# Patient Record
Sex: Female | Born: 1970 | Race: White | Hispanic: Yes | Marital: Single | Smoking: Never smoker
Health system: Southern US, Community
[De-identification: ages and names within clinical notes are randomized; demographics above are authoritative.]

## PROBLEM LIST (undated history)

## (undated) DIAGNOSIS — F329 Major depressive disorder, single episode, unspecified: Secondary | ICD-10-CM

## (undated) DIAGNOSIS — J45909 Unspecified asthma, uncomplicated: Secondary | ICD-10-CM

## (undated) DIAGNOSIS — F32A Depression, unspecified: Secondary | ICD-10-CM

## (undated) DIAGNOSIS — I10 Essential (primary) hypertension: Secondary | ICD-10-CM

## (undated) HISTORY — DX: Unspecified asthma, uncomplicated: J45.909

## (undated) HISTORY — DX: Depression, unspecified: F32.A

## (undated) HISTORY — DX: Essential (primary) hypertension: I10

## (undated) HISTORY — DX: Major depressive disorder, single episode, unspecified: F32.9

---

## 1999-05-26 ENCOUNTER — Other Ambulatory Visit: Admission: RE | Admit: 1999-05-26 | Discharge: 1999-05-26 | Payer: Self-pay | Admitting: Obstetrics and Gynecology

## 1999-09-18 ENCOUNTER — Encounter (INDEPENDENT_AMBULATORY_CARE_PROVIDER_SITE_OTHER): Payer: Self-pay | Admitting: Specialist

## 1999-09-18 ENCOUNTER — Inpatient Hospital Stay (HOSPITAL_COMMUNITY): Admission: AD | Admit: 1999-09-18 | Discharge: 1999-09-20 | Payer: Self-pay | Admitting: Obstetrics and Gynecology

## 1999-10-18 ENCOUNTER — Other Ambulatory Visit: Admission: RE | Admit: 1999-10-18 | Discharge: 1999-10-18 | Payer: Self-pay | Admitting: Obstetrics and Gynecology

## 2001-09-24 ENCOUNTER — Other Ambulatory Visit: Admission: RE | Admit: 2001-09-24 | Discharge: 2001-09-24 | Payer: Self-pay | Admitting: Obstetrics and Gynecology

## 2002-03-30 ENCOUNTER — Inpatient Hospital Stay (HOSPITAL_COMMUNITY): Admission: AD | Admit: 2002-03-30 | Discharge: 2002-04-01 | Payer: Self-pay | Admitting: Obstetrics & Gynecology

## 2002-03-30 ENCOUNTER — Encounter (INDEPENDENT_AMBULATORY_CARE_PROVIDER_SITE_OTHER): Payer: Self-pay | Admitting: Specialist

## 2002-05-14 ENCOUNTER — Other Ambulatory Visit: Admission: RE | Admit: 2002-05-14 | Discharge: 2002-05-14 | Payer: Self-pay | Admitting: Obstetrics and Gynecology

## 2006-03-13 ENCOUNTER — Ambulatory Visit: Payer: Self-pay | Admitting: Pulmonary Disease

## 2006-05-02 ENCOUNTER — Ambulatory Visit: Payer: Self-pay | Admitting: Pulmonary Disease

## 2007-11-27 ENCOUNTER — Emergency Department (HOSPITAL_COMMUNITY): Admission: EM | Admit: 2007-11-27 | Discharge: 2007-11-27 | Payer: Self-pay | Admitting: Emergency Medicine

## 2010-12-31 NOTE — Op Note (Signed)
Patty Howell, Patty Howell                               ACCOUNT NO.:  [id number]   MEDICAL RECORD NO.:  [id number]                   PATIENT TYPE:  INP   LOCATION:  9113                                 FACILITY:  [facility]   PHYSICIAN:  Guy Sandifer. Arleta Creek, M.D.           DATE OF BIRTH:  Feb 15, 1971   DATE OF PROCEDURE:  03/31/2002  DATE OF DISCHARGE:                                 OPERATIVE REPORT   PREOPERATIVE DIAGNOSIS:  Desires permanent sterilization.   POSTOPERATIVE DIAGNOSIS:  Desires permanent sterilization.   PROCEDURE:  Postpartum bilateral tubal ligation.   SURGEON:  Guy Sandifer. Henderson Cloud, M.D.   ANESTHESIA:  General with endotracheal intubation.   ANESTHESIOLOGIST:  Jonita Albee, M.D.   ESTIMATED BLOOD LOSS:  Drops.   INDICATIONS AND CONSENT:  This patient is a 40 year old married female, G6,  P5, status post delivery of a healthy child yesterday afternoon who is doing  well this morning.  Hgb is 11.6, white count 12.7 and platelet count  162,000.  The patient desires permanent sterilization.  Consent has been  signed.  Preoperatively, I discussed with the patient and her husband  bilateral tubal ligation, its potential risks and complications.  This was  also discussed via the rosaria translation line to assure complete  understanding by the patient.  The permanence, failure rate and increased  ectopic risks were discussed.  The potential risks were also discussed  including, but not limited to, infection, organ damage, bleeding requiring  transfusion of blo0od products with possible transfusion reaction, HIV and  hepatitis acquisition.  All questions were answered.  Consent was signed on  the chart.   DESCRIPTION OF PROCEDURE:  The patient was taken to the operating room where  she was placed in the dorsal supine position.  General anesthesia is induced  via endotracheal intubation.  She had been prepped and draped in a sterile  fashion.  A small infraumbilical incision was made  and dissection was  carried out in layers to the peritoneum.  The right fallopian tube was  identified from cornua to fimbria, grasped at its midampullary portion and  the intervening knuckle of tissue was doubly ligated with two free ties of 0  plain suture.  The knuckle was then sharply resected.  Cautery was used to  obtain complete hemostasis.  A similar procedure was carried out on the left  side.  Excellent hemostasis was noted bilaterally.  The incision was closed  with 0 Vicryl in a running fashion to close the fascia and 4-0 Vicryl  subcuticularly to close the skin.  The incision was injected with 0.5% plain  Marcaine prior to closure of the skin.  Good hemostasis was noted.  All  counts were correct.  The patient was awakened and taken to the recovery  room in stable condition.  Guy Sandifer Arleta Creek, M.D.   JET/MEDQ  D:  03/31/2002  T:  03/31/2002  Job:  [id number]

## 2011-05-10 LAB — CBC
HCT: 43.4
MCV: 89
Platelets: 195
RDW: 14.3
WBC: 8.2

## 2011-05-10 LAB — URINALYSIS, ROUTINE W REFLEX MICROSCOPIC
Protein, ur: NEGATIVE
Urobilinogen, UA: 0.2

## 2011-05-10 LAB — DIFFERENTIAL
Eosinophils Absolute: 0.8 — ABNORMAL HIGH
Eosinophils Relative: 10 — ABNORMAL HIGH
Lymphs Abs: 3.6
Monocytes Absolute: 0.4

## 2011-05-10 LAB — BASIC METABOLIC PANEL
BUN: 10
Chloride: 105
Glucose, Bld: 92
Potassium: 3.4 — ABNORMAL LOW

## 2011-05-10 LAB — URINE MICROSCOPIC-ADD ON

## 2011-10-23 ENCOUNTER — Telehealth: Payer: Self-pay

## 2011-10-23 ENCOUNTER — Ambulatory Visit (INDEPENDENT_AMBULATORY_CARE_PROVIDER_SITE_OTHER): Payer: Commercial Managed Care - PPO | Admitting: Family Medicine

## 2011-10-23 VITALS — BP 171/108 | HR 77 | Temp 97.3°F | Resp 24 | Ht 59.5 in | Wt 141.0 lb

## 2011-10-23 DIAGNOSIS — I10 Essential (primary) hypertension: Secondary | ICD-10-CM | POA: Insufficient documentation

## 2011-10-23 DIAGNOSIS — R1110 Vomiting, unspecified: Secondary | ICD-10-CM

## 2011-10-23 DIAGNOSIS — R1013 Epigastric pain: Secondary | ICD-10-CM

## 2011-10-23 MED ORDER — ONDANSETRON 8 MG PO TBDP: 8.0000 mg | ORAL_TABLET | Freq: Three times a day (TID) | ORAL | Status: AC | PRN

## 2011-10-23 MED ORDER — ESOMEPRAZOLE MAGNESIUM 40 MG PO CPDR: 40.0000 mg | DELAYED_RELEASE_CAPSULE | Freq: Every day | ORAL | Status: DC

## 2011-10-23 MED ORDER — LISINOPRIL 20 MG PO TABS: 20.0000 mg | ORAL_TABLET | Freq: Every day | ORAL | Status: DC

## 2011-10-23 NOTE — Telephone Encounter (Signed)
Pt was seen this am and given a free seven day trial for nexium. The promotion is no longer valid and pharmacy would like to know if they can change the prescription to entire 30 days supply as patient will have to pay a co pay of $25.00 for 7 pills or entire month.

## 2011-10-23 NOTE — Telephone Encounter (Signed)
Please call pharmacy back and ok Nexium 20 mg #30 one daily with no refills.

## 2011-10-23 NOTE — Progress Notes (Signed)
This is a 41 year old woman who comes in with 2 children in tow complaining of 2 days of nausea epigastric pain and elevated blood pressure. She's had the epigastric pain in the past over 5 years and it comes on intermittently. She had no diarrhea with it. He says sometimes it comes after eating although can also happen in the middle the night. She has no reflux symptoms.  She complains of no headache, chest pain or shortness of breath. She has no edema.  Objective: Middle-age woman in no acute distress, smiling and pleasant.  HEENT: Unremarkable  Chest: Clear  Heart: Regular no murmur or gallop  Abdomen: Soft nontender without guarding or rebound-no HSM  Skin warm and dry, no edema  Assessment: Hypertension, acute epigastric distress possibly secondary to gallstones  Plan: As patient to return in 24 hours if no better, start lisinopril 20 daily, Zofran 8 mg when necessary #5 every 8 hours, and Nexium 40 mg daily x7

## 2011-10-23 NOTE — Patient Instructions (Signed)
Hipertensin (Hypertension) Cuando el corazn late bombea la sangre a travs de las arterias. La fuerza que se origina es la presin arterial. Si la presin es demasiado elevada, se denomina hipertensin. El peligro radica en que puede sufrirla y no saberlo. Hipertensin puede significar que su corazn debe trabajar ms intensamente para bombear sangre. Las arterias pueden estar estrechas o rgidas. El trabajo extra aumenta el riesgo de enfermedades cardacas, ictus y otros problemas.  La presin arterial est formada por dos nmeros: el nmero mayor sobre el nmero menor, por ejemplo110/70. Se seala "110/72". Los valores ideales son por debajo de 120 para el nmero ms alto (sistlica) y por debajo de 80 para el ms bajo (diastlica). Anote su presin sangunea hoy. Debe prestar mucha atencin a su presin arterial si sufre alguna otra enfermedad como:  Insuficiencia cardaca   Ataques cardiacos previos   Diabetes   Enfermedad renal crnica   Ictus previo   Mltiples factores de riesgo para enfermedades cardacas  Para diagnosticar si usted sufre hipertensin arterial, debe medirse la presin mientras encuentra sentado con el brazo elevado a la altura del nivel del corazn. Debe medirse al menos 2 veces. Una nica lectura de presin arterial elevada (especialmente en el servicio de emergencias) no significa que necesita tratamiento. Hay enfermedades en las que la presin arterial es diferente en ambos brazos. Es importante que consulte rpidamente con su mdico para un control. La mayora de las personas sufren hipertensin esencial, lo que significa que no tiene una causa especfica. Este tipo de hipertensin puede bajarse modificando algunos factores en el estilo de vida como:  Estrs.   El consumo de cigarrillos.   La falta de actividad fsica.   Peso excesivo   Consumo de drogas y alcohol.   Consumiendo menos sal  La mayora de las personas no tienen sntomas hasta que la  hipertensin ocasiona un dao en el organismo. El tratamiento efectivo puede evitar, demorar o reducir ese dao. TRATAMIENTO: El tratamiento para la hipertensin, cuando se ha identificado una causa, est dirigido a la misma Hay un gran nmero en medicamentos para tratarla. Se agrupan en diferentes categoras y el mdico seleccionar los medicamentos indicados para usted. Muchos medicamentos disponibles tienen efectos secundarios. Debe comentar los efectos secundarios con su mdico. Si la presin arterial permanece elevada despus de modificar su estilo de vida o comenzar a tomar medicamentos:  Los medicamentos deben ser reemplazados   Puede ser necesario evaluar otros problemas.   Debe estar seguro que comprende las indicaciones, que sabe cmo y cundo tomar los medicamentos.   Asegrese de realizar un control con su mdico dentro del tiempo indicado (generalmente dentro de las dos semanas) para volver a evaluar la presin arterial y revisar los medicamentos prescritos.   Si est tomando ms de un medicamento para la presin arterial, asegrese que sabe cmo y en qu momentos debe tomarlos. Tomar los medicamentos al mismo tiempo puede dar como resultado un gran descenso en la presin arterial.  SOLICITE ATENCIN MDICA DE INMEDIATO SI PRESENTA:  Dolor de cabeza intenso, visin borrosa o cambios en la visin, o confusin.   Debilidad o adormecimientos inusuales o sensacin de desmayo.   Dolor de pecho o abdominal intenso, vmitos o problemas para respirar.  ASEGRESE QUE:   Comprende estas instrucciones.   Controlar su enfermedad.   Solicitar ayuda inmediatamente si no mejora o si empeora.  Document Released: 08/01/2005 Document Revised: 07/21/2011 ExitCare Patient Information 2012 [employer]. 

## 2011-10-24 MED ORDER — ESOMEPRAZOLE MAGNESIUM 40 MG PO CPDR: 40.0000 mg | DELAYED_RELEASE_CAPSULE | Freq: Every day | ORAL | Status: AC

## 2011-10-31 ENCOUNTER — Ambulatory Visit (INDEPENDENT_AMBULATORY_CARE_PROVIDER_SITE_OTHER): Payer: Commercial Managed Care - PPO | Admitting: Family Medicine

## 2011-10-31 VITALS — BP 143/87 | HR 71 | Temp 97.8°F | Resp 16 | Ht 59.0 in | Wt 140.6 lb

## 2011-10-31 DIAGNOSIS — R42 Dizziness and giddiness: Secondary | ICD-10-CM

## 2011-10-31 DIAGNOSIS — R51 Headache: Secondary | ICD-10-CM

## 2011-10-31 MED ORDER — BUTALBITAL-APAP-CAFFEINE 50-325-40 MG PO TABS: 1.0000 | ORAL_TABLET | Freq: Two times a day (BID) | ORAL | Status: AC | PRN

## 2011-10-31 MED ORDER — FLUOXETINE HCL 20 MG PO TABS: 20.0000 mg | ORAL_TABLET | Freq: Every day | ORAL | Status: DC

## 2011-10-31 NOTE — Progress Notes (Signed)
Abdominal pain is resolved.  Still having daily headaches and some dizziness.  Wants what she had last year for the dizziness and headaches (fluoxetine, according to paper chart).  O:   Exam unchanged - alert, NAD Neuro intact HEENT unremarkable Chest clear Heart reg Abdomen: soft nontender  A:  Depression equivalent  P: fluoxetine 20 qd esgic bid prn

## 2012-07-09 ENCOUNTER — Other Ambulatory Visit: Payer: Self-pay | Admitting: Family Medicine

## 2012-07-09 NOTE — Telephone Encounter (Signed)
Please pull chart - not rx'ed in [facility] 

## 2012-07-10 NOTE — Telephone Encounter (Signed)
Chart pulled to PA pool at nurses station DOS 12/24/09

## 2012-11-20 ENCOUNTER — Ambulatory Visit (INDEPENDENT_AMBULATORY_CARE_PROVIDER_SITE_OTHER): Payer: Commercial Managed Care - PPO | Admitting: Family Medicine

## 2012-11-20 VITALS — BP 154/93 | HR 72 | Temp 97.8°F | Resp 16 | Ht 58.5 in | Wt 143.6 lb

## 2012-11-20 DIAGNOSIS — F3289 Other specified depressive episodes: Secondary | ICD-10-CM

## 2012-11-20 DIAGNOSIS — I10 Essential (primary) hypertension: Secondary | ICD-10-CM

## 2012-11-20 DIAGNOSIS — F32A Depression, unspecified: Secondary | ICD-10-CM

## 2012-11-20 DIAGNOSIS — F329 Major depressive disorder, single episode, unspecified: Secondary | ICD-10-CM

## 2012-11-20 MED ORDER — LISINOPRIL 10 MG PO TABS: 10.0000 mg | ORAL_TABLET | Freq: Two times a day (BID) | ORAL | Status: DC

## 2012-11-20 MED ORDER — FLUOXETINE HCL 20 MG PO TABS: 20.0000 mg | ORAL_TABLET | Freq: Every day | ORAL | Status: DC

## 2012-11-20 NOTE — Patient Instructions (Signed)
La dieta DASH  (DASH Diet) La dieta DASH (por su nombre en ingls) significa "Abordaje diettico para detener la hipertensin". Es un plan de alimentacin saludable que ha demostrado reducir la presin arterial elevada (hipertensin) en tan solo [address] mientras probablemente tambin ofrezca otros beneficios significativos para la salud. Estos otros beneficios incluyen la reduccin del riesgo de sufrir cncer de mama despus de la menopausia y de sufrir diabetes tipo 2, enfermedades cardacas, cncer de colon e ictus. Los beneficios para la salud tambin incluyen la prdida de peso y la disminucin del riesgo de insuficiencia renal en pacientes con enfermedad renal crnica.  GUAS PARA LA DIETA   Limite la cantidad de sal (sodio). Su dieta debe contener menos de 1500 mg de [employer].  Limite el consumo de carbohidratos refinados o procesados. Su dieta debe incluir principalmente granos enteros. Consuma postres con moderacin y limite el agregado de Location manager.  Incluya pequeas cantidades de grasas saludables para el corazn. Estos tipos de grasas estn contenidas en las nueces, aceites y [place] y trans. Estas grasas han demostrado ser perjudiciales para el organismo. ELECCIN DE LOS ALIMENTOS  Los siguientes grupos de alimentos se basan en una dieta de 2000 caloras. Consulte a su nutricionista matriculado cules son sus necesidades calricas individuales.  Granos y productos elaborados con granos (6 a 8 porciones por Training and development officer).   Consuma ms a menudo: Pan integral, arroz integral, pasta de trigo o de [place] integral, quinoa, palomitas de maz sin grasa o sal aadida (infladas).  Consuma con menos frecuencia:  Pan blanco, pastas blancas, arroz blanco, pan de maz. Verduras (4 a 5 porciones por Training and development officer).   Consuma ms a menudo: Hortalizas frescas, congeladas y IT sales professional. Puede consumir las hortalizas crudas, al vapor, asadas o grilladas con una mnima cantidad de  grasas.  Consuma con menos frecuencia o evite: Vegetales con crema o fritos. Verduras en [place]. Frutas (4 a 5 porciones por da).  Consuma ms a menudo: Frutas frescas, en conserva (en su jugo natural) o frutas congeladas. Frutas secas sin el agregado de azcar. Cien por ciento jugo de fruta ( taza [237 ml] por da).  Consuma con menos frecuencia: Frutas secas con azcar agregado. Fruta enlatada en almbar light o espeso. Carnes magras, pescado y aves de corral (2 porciones o Programmer, multimedia. Una porcin es de 3 a 4 oz. HD:[id number] g]).  Consuma ms a menudo: Carne molida 90% magra o ms de lomo o solomillo . Cortes redondos de carne, [place] de pollo y de [place]. Todos los pescados. Prepare la carne al horno o asada a la parrilla o al grill. Nada debe ser frito.  Consuma con menos frecuencia o evite: Cortes grasos de carne, [place] o pata, muslo o ala de pollo. Cortes de carne o pescado fritos. Lcteos (2 a 3 porciones)  Consuma ms a menudo: La [employer] o con bajo contenido de grasa, yogur descremado o semidescremado, queso con bajo contenido en grasa o parcialmente descremado.  Consuma con menos frecuencia o evite: Leche (entera, al 2%). [place] Quesos con toda su grasa. Nueces, semillas y legumbres (4 a 5 porciones por semana).  Consuma ms a menudo: Todos los alimentos sin sal agregada.  Consuma con menos frecuencia o evite: Nueces y semillas con sal, frijoles enlatados con sal agregada. Grasas y dulces (limitados)  Consuma ms a menudo: Art gallery manager, margarinas en pote sin grasas trans, gelatina sin azcar. [place] y aderezos para Associate Professor.  Consuma  con menos frecuencia o evite: Aceites de coco, aceites de palma, mantequilla, margarina en barra, crema, [employer] y [address], [place], [place], [place]. Irven Shelling MS INFORMACIN  El Dash Diet Eating Plan (Plan Nutricional Dieta Dash): www.dashdiet.org  Document Released: 07/21/2011 Document  Revised: 10/24/2011 [facility] Patient Information 2013 [place], [place]. Hipertensin (Hypertension) Cuando el corazn late [employer] sangre a travs de las arterias. La fuerza que se origina es la presin arterial. Si la presin es demasiado elevada, se denomina hipertensin. El peligro radica en que puede sufrirla y no saberlo. Hipertensin puede significar que su corazn debe trabajar ms intensamente para bombear sangre. Las arterias pueden Dietitian o rgidas. El [place] extra [place] el riesgo de enfermedades cardacas, ictus y [employer].  La presin arterial est formada por dos nmeros: el nmero mayor sobre el nmero menor, por ejemplo110/70. Se seala "110/72". Los valores ideales son por debajo de 120 para el nmero ms alto (sistlica) y por debajo de 80 para el ms bajo (diastlica). Anote su presin sangunea hoy. Debe prestar mucha atencin a su presin arterial si sufre alguna otra enfermedad como:  Insuficiencia cardaca  Ataques cardiacos previos  Diabetes  Enfermedad renal crnica  Ictus previo  Mltiples factores de riesgo para enfermedades cardacas Para diagnosticar si usted sufre hipertensin arterial, debe medirse la presin mientras encuentra sentado con el brazo elevado a la altura del nivel del corazn. Debe medirse al menos 2 veces. Una nica lectura de presin arterial elevada (especialmente en el servicio de emergencias) no significa que necesita tratamiento. Hay enfermedades en las que la presin arterial es diferente en ambos brazos. Es importante que consulte rpidamente con su mdico para un control. La [employer] de las personas sufren hipertensin esencial, lo que significa que no tiene una causa especfica. Este tipo de hipertensin puede bajarse modificando algunos factores en el estilo de vida como:  Librarian, academic.  El consumo de cigarrillos.  La falta de actividad fsica.  Peso excesivo  Consumo de drogas y alcohol.  Consumiendo menos sal La  mayora de las personas no tienen sntomas hasta que la hipertensin ocasiona un dao en el organismo. El tratamiento efectivo puede evitar, Designer, industrial/product o reducir ese dao. TRATAMIENTO: El tratamiento para la hipertensin, cuando se ha identificado una causa, est dirigido a la misma Hay un gran nmero en medicamentos para tratarla. Se agrupan en diferentes categoras y Media planner los medicamentos indicados para usted. Muchos medicamentos disponibles tienen efectos secundarios. Debe comentar los efectos secundarios con su mdico. Si la presin arterial permanece elevada despus de modificar su estilo de vida o comenzar a tomar medicamentos:  Los medicamentos deben ser reemplazados  Puede ser necesario evaluar otros problemas.  Debe estar seguro que comprende las indicaciones, que sabe cmo y cundo [employer].  Asegrese de Education officer, environmental un control con su mdico dentro del tiempo indicado (generalmente dentro de las [employer]) para volver a Systems analyst presin arterial y Dentist los medicamentos prescritos.  Si est tomando ms de un medicamento para la presin arterial, asegrese que sabe cmo y en qu momentos debe tomarlos. Tomar los medicamentos al mismo tiempo puede dar como resultado un gran descenso en la presin arterial. SOLICITE ATENCIN MDICA DE INMEDIATO SI PRESENTA:  Dolor de cabeza intenso, visin borrosa o cambios en la visin, o confusin.  Debilidad o adormecimientos inusuales o sensacin de desmayo.  Dolor de pecho o abdominal intenso, vmitos o problemas para respirar. ASEGRESE QUE:   Comprende estas instrucciones.  Controlar su enfermedad.  Solicitar ayuda inmediatamente si no mejora o si empeora. Document Released: 08/01/2005 Document Revised: 10/24/2011 [facility] Patient Information 2013 [place], [place].

## 2012-11-20 NOTE — Progress Notes (Signed)
 [facility]:  Office Visit  Chief Complaint:  Chief Complaint  Patient presents with  . Medication Refill    fluoxetine  . Headache    HPI: Patty Howell is a 42 y.o. female who complains of  3 day history of headache with nausea. She took tylenol for HA and has gotten better. No problem with CP/SOB, vision changes, n/v. She has been out for last 3 months of both depression and HTN meds.  HTN-Out of meds for 3 months. Compliant with meds when she has them. Does not follow low salt diet. Dizziness with sitting and standing. 1 x per week. Does not have this problem with medicine. No cough, numbness, tingling.  Depression-has had this for 7-8 years. Feels good on meds. Has not been on meds for 3 months. No SI/HI.   Past Medical History  Diagnosis Date  . Asthma   . Hypertension   . Depression    History reviewed. No pertinent past surgical history. History   Social History  . Marital Status: Single    Spouse Name: N/A    Number of Children: N/A  . Years of Education: N/A   Social History Main Topics  . Smoking status: Never Smoker   . Smokeless tobacco: Never Used  . Alcohol Use: No  . Drug Use: No  . Sexually Active: None   Other Topics Concern  . None   Social History Narrative  . None   Family History  Problem Relation Age of Onset  . Hypertension Mother    No Known Allergies Prior to Admission medications   Medication Sig Start Date End Date Taking? Authorizing Provider  FLUoxetine (PROZAC) 20 MG tablet Take 20 mg by mouth daily.   Yes Historical Provider, MD  FLUoxetine (PROZAC) 20 MG tablet Take 1 tablet (20 mg total) by mouth daily. 10/31/11 01/29/12  Elvina Sidle, MD  ondansetron (ZOFRAN-ODT) 8 MG disintegrating tablet Take 8 mg by mouth every 8 (eight) hours as needed.    Historical Provider, MD     ROS: The patient denies fevers, chills, night sweats, unintentional weight loss, chest pain, palpitations, wheezing, dyspnea on exertion,  nausea, vomiting, abdominal pain, dysuria, hematuria, melena, numbness, weakness, or tingling.   All other systems have been reviewed and were otherwise negative with the exception of those mentioned in the HPI and as above.    PHYSICAL EXAM: Filed Vitals:   11/20/12 1640  BP: 154/93  Pulse: 72  Temp: 97.8 F (36.6 C)  Resp: 16   Filed Vitals:   11/20/12 1640  Height: 4' 10.5" (1.486 m)  Weight: 143 lb 9.6 oz (65.137 kg)   Body mass index is 29.5 kg/(m^2).  General: Alert, no acute distress HEENT:  Normocephalic, atraumatic, oropharynx patent. EOMI, PERRLA. + dental caries.  Cardiovascular:  Regular rate and rhythm, no rubs murmurs or gallops.  No Carotid bruits, radial pulse intact. No pedal edema.  Respiratory: Clear to auscultation bilaterally.  No wheezes, rales, or rhonchi.  No cyanosis, no use of accessory musculature GI: No organomegaly, abdomen is soft and non-tender, positive bowel sounds.  No masses. Skin: No rashes. Neurologic: Facial musculature symmetric. Psychiatric: Patient is appropriate throughout our interaction. Lymphatic: No cervical lymphadenopathy Musculoskeletal: Gait intact.   LABS: Results for orders placed in visit on 11/20/12  COMPREHENSIVE METABOLIC PANEL      Result Value Range   Sodium 138  135 - 145 mEq/L   Potassium 4.2  3.5 - 5.3 mEq/L   Chloride  106  96 - 112 mEq/L   CO2 24  19 - 32 mEq/L   Glucose, Bld 88  70 - 99 mg/dL   BUN 13  6 - 23 mg/dL   Creat 4.78  2.95 - 6.21 mg/dL   Total Bilirubin 0.3  0.3 - 1.2 mg/dL   Alkaline Phosphatase 69  39 - 117 U/L   AST 21  0 - 37 U/L   ALT 20  0 - 35 U/L   Total Protein 7.6  6.0 - 8.3 g/dL   Albumin 4.0  3.5 - 5.2 g/dL   Calcium 9.0  8.4 - 30.8 mg/dL  TSH      Result Value Range   TSH 2.676  0.350 - 4.500 uIU/mL     EKG/XRAY:   Primary read interpreted by Dr. Conley Rolls at [facility].   ASSESSMENT/PLAN: Encounter Diagnoses  Name Primary?  . HTN (hypertension) Yes  . Depression    Refilled  Prozac Refilled Lisonpril changefrom 20 mg daily  to 10 mg BID to have less dizzy sxs with smaller dose and change in frequency I did not check orthostatics, since only occurs 1x per week. Advise to get up slowly when and move slowly during transition F/u in 6 months for BP check or sooner if dizziness continues     ,  PHUONG, DO 11/21/2012 6:22 PM

## 2012-11-21 ENCOUNTER — Encounter: Payer: Self-pay | Admitting: Family Medicine

## 2012-11-21 LAB — COMPREHENSIVE METABOLIC PANEL
Alkaline Phosphatase: 69 U/L (ref 39–117)
BUN: 13 mg/dL (ref 6–23)
Creat: 0.66 mg/dL (ref 0.50–1.10)
Glucose, Bld: 88 mg/dL (ref 70–99)
Sodium: 138 mEq/L (ref 135–145)
Total Bilirubin: 0.3 mg/dL (ref 0.3–1.2)
Total Protein: 7.6 g/dL (ref 6.0–8.3)

## 2012-11-21 LAB — COMPREHENSIVE METABOLIC PANEL WITH GFR
ALT: 20 U/L (ref 0–35)
AST: 21 U/L (ref 0–37)
Albumin: 4 g/dL (ref 3.5–5.2)
CO2: 24 meq/L (ref 19–32)
Calcium: 9 mg/dL (ref 8.4–10.5)
Chloride: 106 meq/L (ref 96–112)
Potassium: 4.2 meq/L (ref 3.5–5.3)

## 2012-11-21 LAB — TSH: TSH: 2.676 u[IU]/mL (ref 0.350–4.500)

## 2012-11-22 ENCOUNTER — Telehealth: Payer: Self-pay | Admitting: Radiology

## 2012-11-22 NOTE — Telephone Encounter (Signed)
Message copied by Caffie Damme on Thu Nov 22, 2012  3:38 PM ------      Message from: LE, [place] P      Created: Thu Nov 22, 2012  1:03 PM       Please let her know that kidneys, liver, thyroid are normal. See her in 6 months for follow-up ------

## 2012-11-22 NOTE — Telephone Encounter (Signed)
Called patient to advise. Left message for patient to call back.

## 2012-11-25 NOTE — Telephone Encounter (Signed)
Spoke with pt advised labs normal. Pt understood

## 2013-02-04 ENCOUNTER — Ambulatory Visit (INDEPENDENT_AMBULATORY_CARE_PROVIDER_SITE_OTHER): Payer: Commercial Managed Care - PPO | Admitting: Internal Medicine

## 2013-02-04 VITALS — BP 110/70 | HR 70 | Temp 97.4°F | Resp 18 | Ht 59.0 in | Wt 137.0 lb

## 2013-02-04 DIAGNOSIS — R51 Headache: Secondary | ICD-10-CM

## 2013-02-04 DIAGNOSIS — R4589 Other symptoms and signs involving emotional state: Secondary | ICD-10-CM

## 2013-02-04 DIAGNOSIS — R519 Headache, unspecified: Secondary | ICD-10-CM

## 2013-02-04 DIAGNOSIS — R42 Dizziness and giddiness: Secondary | ICD-10-CM

## 2013-02-04 DIAGNOSIS — F329 Major depressive disorder, single episode, unspecified: Secondary | ICD-10-CM

## 2013-02-04 MED ORDER — SERTRALINE HCL 100 MG PO TABS: 100.0000 mg | ORAL_TABLET | Freq: Every day | ORAL | Status: DC

## 2013-02-04 MED ORDER — ONDANSETRON 8 MG PO TBDP: 8.0000 mg | ORAL_TABLET | Freq: Three times a day (TID) | ORAL | Status: DC | PRN

## 2013-02-04 NOTE — Patient Instructions (Addendum)
Stop fluoxetine and start sertraline Take ondarsetron(zofran) for nausea of Headache and take 2 tylenol for Headache-if not better in 2 months then recheck

## 2013-02-04 NOTE — Progress Notes (Signed)
  Subjective:    Patient ID: Patty Howell, female    DOB: 1970-11-24, 42 y.o.   MRN: [id number]  HPI translation is by 8th grade daughter c/o daily HAs that cause dizziness, nausea and vomiting after meals especially See 11/2012--restarting meds didn't alter this problem which has been present for several years. No nocturnal headaches but occasionally present when wakes. No vision changes. Gets dizzy and heat particularly makes headache and dizziness worse. Mother and daughter are in counseling and daughter think she causes the stress that makes her mother need to be on Prozac. She has been on Prozac for several years and feels like it is not effective this time. Denies reflux.  Note last labs normal including TSH   Review of Systems  Constitutional: Negative for fever, activity change, appetite change, fatigue and unexpected weight change.  Eyes: Negative for photophobia and visual disturbance.  Respiratory: Negative for shortness of breath.   Cardiovascular: Negative for chest pain, palpitations and leg swelling.  Gastrointestinal: Negative for abdominal pain, diarrhea and constipation.  Genitourinary: Negative for difficulty urinating.  Neurological: Negative for tremors, syncope, speech difficulty, weakness and numbness.  Psychiatric/Behavioral: Negative for sleep disturbance.       Objective:   Physical Exam BP 110/70  Pulse 70  Temp(Src) 97.4 F (36.3 C) (Oral)  Resp 18  Ht 4\' 11"  (1.499 m)  Wt 137 lb (62.143 kg)  BMI 27.66 kg/m2  SpO2 100%  LMP 01/23/2013 Pupils equal round reactive to light and accommodation/EOMs conjugate/conjunctiva clear ENT clear No thyromegaly Heart regular without murmur click/no carotid bruits Lungs clear Extremities no edema/good peripheral pulses Neurological intact       Assessment & Plan:  Problem #1 daily headaches Problem #2 dizzy spells associated with headaches Problem #3 nausea and vomiting associated with headaches post  eating Problem #4 depression Problem #5 hypertension now controlled  Change Prozac to Zoloft Continue lisinopril 10 mg twice a day Use Tylenol for headache plus Zofran for nausea Recheck in one month if not stable  Meds ordered this encounter  Medications  . sertraline (ZOLOFT) 100 MG tablet    Sig: Take 1 tablet (100 mg total) by mouth daily.    Dispense:  30 tablet    Refill:  11  . ondansetron (ZOFRAN-ODT) 8 MG disintegrating tablet    Sig: Take 1 tablet (8 mg total) by mouth every 8 (eight) hours as needed.    Dispense:  20 tablet    Refill:  3

## 2013-08-10 ENCOUNTER — Ambulatory Visit: Payer: Commercial Managed Care - PPO

## 2013-08-10 ENCOUNTER — Ambulatory Visit (INDEPENDENT_AMBULATORY_CARE_PROVIDER_SITE_OTHER): Payer: Commercial Managed Care - PPO | Admitting: Internal Medicine

## 2013-08-10 VITALS — BP 158/100 | HR 97 | Temp 97.6°F | Resp 16 | Ht 59.0 in | Wt 137.8 lb

## 2013-08-10 DIAGNOSIS — M79671 Pain in right foot: Secondary | ICD-10-CM

## 2013-08-10 DIAGNOSIS — M722 Plantar fascial fibromatosis: Secondary | ICD-10-CM

## 2013-08-10 DIAGNOSIS — J45909 Unspecified asthma, uncomplicated: Secondary | ICD-10-CM

## 2013-08-10 DIAGNOSIS — I10 Essential (primary) hypertension: Secondary | ICD-10-CM

## 2013-08-10 DIAGNOSIS — M79609 Pain in unspecified limb: Secondary | ICD-10-CM

## 2013-08-10 MED ORDER — ALBUTEROL SULFATE HFA 108 (90 BASE) MCG/ACT IN AERS: 2.0000 | INHALATION_SPRAY | Freq: Four times a day (QID) | RESPIRATORY_TRACT | Status: DC | PRN

## 2013-08-10 MED ORDER — MELOXICAM 15 MG PO TABS: 15.0000 mg | ORAL_TABLET | Freq: Every day | ORAL | Status: DC

## 2013-08-10 NOTE — Progress Notes (Signed)
Subjective:  This chart was scribed for Patty Sia, MD by Carl Best, Medical Scribe. This patient was seen in Room 13 and the patient's care was started at 11:29 AM.   Patient ID: Patty Howell, female    DOB: Nov 01, 1970, 42 y.o.   MRN: [id number]  HPI HPI Comments: [name] is a 42 y.o. female who presents to the [facility] complaining of constant right heel pain that started two months ago.  The patient's daughter states that walking aggravates the pain.  She states that the patient's pain is the most severe in the morning.  No known injury  Also complaining of two-day history of coughing and wheezing without fever sore throat or nasal congestion There is an underlying history of asthma and she no longer has any medication/she typically needs albuterol occasionally Cough is nonproductive  Past Medical History  Diagnosis Date  . Asthma   . Hypertension   . Depression    History reviewed. No pertinent past surgical history. Family History  Problem Relation Age of Onset  . Hypertension Mother    History   Social History  . Marital Status: Single    Spouse Name: N/A    Number of Children: N/A  . Years of Education: N/A   Occupational History  . Not on file.   Social History Main Topics  . Smoking status: Never Smoker   . Smokeless tobacco: Never Used  . Alcohol Use: No  . Drug Use: No  . Sexual Activity: Not on file   Other Topics Concern  . Not on file   Social History Narrative  . No narrative on file   No Known Allergies  Review of Systems A complete 10 system review of systems was obtained and all systems are negative except as noted in the HPI and PMH.  And also that she is no longer taking her medicine for hypertension although she has refills at the drugstore   Objective:  Physical Exam  Nursing note and vitals reviewed. Constitutional: She is oriented to person, place, and time. She appears well-developed and well-nourished.  No distress.  HENT:  Head: Normocephalic and atraumatic.  Eyes: Conjunctivae and EOM are normal. Pupils are equal, round, and reactive to light.  Neck: Normal range of motion and phonation normal. Neck supple.  Cardiovascular: Normal rate, regular rhythm and intact distal pulses.   Pulmonary/Chest: Effort normal and breath sounds normal. She exhibits no tenderness.  Abdominal: Soft. She exhibits no distension. There is no tenderness. There is no guarding.  Musculoskeletal: Normal range of motion.  Neurological: She is alert and oriented to person, place, and time. No cranial nerve deficit. She exhibits normal muscle tone.  Skin: Skin is warm and dry.  Psychiatric: She has a normal mood and affect. Her behavior is normal. Judgment and thought content normal.      [facility] preliminary x-ray report read by Dr. Merla Riches - heel spur present  BP 158/100  Pulse 97  Temp(Src) 97.6 F (36.4 C) (Oral)  Resp 16  Ht 4\' 11"  (1.499 m)  Wt 137 lb 12.8 oz (62.506 kg)  BMI 27.82 kg/m2  SpO2 100%  LMP 07/31/2013  Assessment & Plan:  Pain, heel, right - Plan: DG Os Calcis Right  Plantar fasciitis - Plan: meloxicam (MOBIC) 15 MG tablet  RAD (reactive airway disease)  Unspecified essential hypertension  Plan as detailed in the patient instructions for her plantar fasciitis Call in one month if not well for referral to  podiatry Restart albuterol Restart lisinopril Followup blood pressure in one month Followup asthma 1-3 months  I have completed the patient encounter in its entirety as documented by the scribe, with editing by me where necessary. Robert P. Merla Riches, M.D.

## 2013-08-10 NOTE — Patient Instructions (Signed)
Fascitis plantar (Plantar Fascitis) La fascitis plantar es un trastorno frecuente que ocasiona dolor en el pie. Se trata de una inflamacin (irritacin) de la banda de tejidos fibrosos y resistentes que se extienden desde el hueso del taln (calcneo) hasta la parte anterior de la planta del pie. Esta inflamacin puede deberse a que ha permanecido Intel, a zapatos que no Holiday representative, a correr Aflac Incorporated, al sobrepeso, a un andar anormal y el uso excesivo del pie con dolor (esto es frecuente en los corredores). Tambin es frecuente [employer] que practican ejercicios aerbicos y [place] bailarines de ballet. SNTOMAS La persona que sufre fascitis plantar manifiesta:  Dolor intenso por la Assurant parte inferior del pie, especialmente al dar los primeros pasos luego de levantarse de la cama. Este dolor disminuye luego de caminar algunos minutos.  Dolor intenso al caminar Express Scripts de un tiempo prolongado de inactividad.  El dolor empeora al caminar descalzo o al subir escaleras. DIAGNSTICO  El mdico har el diagnstico examinando sus pies.  En general no es necesario indicar radiografas. PREVENCIN  Consulte a Nurse, mental health en medicina del deporte antes de comenzar un nuevo programa de ejercicios.  Los programas de caminatas ofrecen un buen entrenamiento. Hay una menor probabilidad de sufrir lesiones por uso excesivo, que son frecuentes [place] personas que corren. Hay menos impacto y menos agresin a las articulaciones.  Comience lentamente todo nuevo programa de ejercicios. Si aparece algn problema o dolor, disminuya la cantidad de tiempo o la distancia hasta que se encuentre cmodo.  Use calzado de buena calidad y reemplcelo regularmente.  Estire el pie y los ligamentos que se encuentran en la parte posterior del tobillo (tendn de [place]) antes y despus de Education officer, environmental actividad fsica.  Corra o practique ejercicios sobre  superficies parejas que no sean duras. Por ejemplo, el asfalto es mejor que el pavimento.  No corra descalzo sobre superficies duras.  Si camina sobre cinta, vare la inclinacin.  No siga con el entrenamiento si tiene problemas en el pie o en la articulacin. Busque ayuda profesional si no mejora. INSTRUCCIONES PARA EL CUIDADO DOMICILIARIO  Evite las SUPERVALU INC causan dolor hasta que se recupere.  Use hielo o compresas fras sobre las zonas doloridas despus de Education officer, environmental ejercicios.  Only take over-the-counter or prescription medicines for pain, discomfort, or fever as directed by your caregiver.  Las plantillas blandas o las zapatillas con base de aire o gel pueden ser de [place].  Si los problemas continan o se agravan, consulte a Music therapist en medicina del deporte o con su mdico personal. La cortisona es un potente antiinflamatorio que puede inyectarse en la zona dolorida. El profesional que lo asiste comentar este tratamiento con usted. EST SEGURO QUE:   Comprende las instrucciones para el alta mdica.  Controlar su enfermedad.  Solicitar atencin mdica de inmediato segn las indicaciones. Document Released: 05/11/2005 Document Revised: 10/24/2011 [facility] Patient Information 2014 [place], [place]. Plantar Fasciitis Plantar fasciitis is a common condition that causes foot pain. It is soreness (inflammation) of the band of tough fibrous tissue on the bottom of the foot that runs from the heel bone (calcaneus) to the ball of the foot. The cause of this soreness may be from excessive standing, poor fitting shoes, running on hard surfaces, being overweight, having an abnormal walk, or overuse (this is common in runners) of the painful foot or feet. It is also common in aerobic exercise dancers and ballet  dancers. SYMPTOMS  Most people with plantar fasciitis complain of:  Severe pain in the morning on the bottom of their foot especially when taking the first steps  out of bed. This pain recedes after a few minutes of walking.  Severe pain is experienced also during walking following a long period of inactivity.  Pain is worse when walking barefoot or up stairs DIAGNOSIS   Your caregiver will diagnose this condition by examining and feeling your foot.  Special tests such as X-rays of your foot, are usually not needed. PREVENTION   Consult a sports medicine professional before beginning a new exercise program.  Walking programs offer a good workout. With walking there is a lower chance of overuse injuries common to runners. There is less impact and less jarring of the joints.  Begin all new exercise programs slowly. If problems or pain develop, decrease the amount of time or distance until you are at a comfortable level.  Wear good shoes and replace them regularly.  Stretch your foot and the heel cords at the back of the ankle (Achilles tendon) both before and after exercise.  Run or exercise on even surfaces that are not hard. For example, asphalt is better than pavement.  Do not run barefoot on hard surfaces.  If using a treadmill, vary the incline.  Do not continue to workout if you have foot or joint problems. Seek professional help if they do not improve. HOME CARE INSTRUCTIONS   Avoid activities that cause you pain until you recover.  Use ice or cold packs on the problem or painful areas after working out.  Only take over-the-counter or prescription medicines for pain, discomfort, or fever as directed by your caregiver.  Soft shoe inserts or athletic shoes with air or gel sole cushions may be helpful.  If problems continue or become more severe, consult a sports medicine caregiver or your own health care provider. Cortisone is a potent anti-inflammatory medication that may be injected into the painful area. You can discuss this treatment with your caregiver. MAKE SURE YOU:   Understand these instructions.  Will watch your  condition.  Will get help right away if you are not doing well or get worse. Document Released: 04/26/2001 Document Revised: 10/24/2011 Document Reviewed: 06/25/2008 [facility] Patient Information 2014 [place], [place].   PLAN= Heel pad for that foot--available in drugstore  Stretches for foot twice a day  Ice to heel for 20 minutes after work  Mobic(meloxicam) daily for 1 month  If this doesn't cure it then we will make appointment for her with a foot specialist---you may call back

## 2013-11-05 ENCOUNTER — Ambulatory Visit (INDEPENDENT_AMBULATORY_CARE_PROVIDER_SITE_OTHER): Payer: Commercial Managed Care - PPO | Admitting: Emergency Medicine

## 2013-11-05 VITALS — BP 150/92 | HR 72 | Temp 97.5°F | Resp 16 | Ht 59.0 in | Wt 139.0 lb

## 2013-11-05 DIAGNOSIS — R4589 Other symptoms and signs involving emotional state: Secondary | ICD-10-CM

## 2013-11-05 DIAGNOSIS — F3289 Other specified depressive episodes: Secondary | ICD-10-CM

## 2013-11-05 DIAGNOSIS — Z760 Encounter for issue of repeat prescription: Secondary | ICD-10-CM

## 2013-11-05 DIAGNOSIS — F329 Major depressive disorder, single episode, unspecified: Secondary | ICD-10-CM

## 2013-11-05 MED ORDER — SERTRALINE HCL 100 MG PO TABS: 100.0000 mg | ORAL_TABLET | Freq: Every day | ORAL | Status: DC

## 2013-11-05 NOTE — Progress Notes (Signed)
   Subjective:    Patient ID: Patty Howell, female    DOB: [birth year], 43 y.o.   MRN: [id number]  HPI 43 yo female with complaint of out of sertraline.  Has been on it for 10 years without complications.  No new complaints.  Otherwise doing well.  PPMH:  Hypertension   Review of Systems  Constitutional: Negative for fever and chills.  Respiratory: Negative for chest tightness and shortness of breath.   Cardiovascular: Negative for chest pain.  Genitourinary: Positive for difficulty urinating.  Psychiatric/Behavioral: Negative for suicidal ideas and agitation. The patient is not nervous/anxious.        Objective:   Physical Exam Blood pressure 150/92, pulse 72, temperature 97.5 F (36.4 C), temperature source Oral, resp. rate 16, height 4\' 11"  (1.499 m), weight 139 lb (63.05 kg), last menstrual period 10/24/2013, SpO2 99.00%. Body mass index is 28.06 kg/(m^2). Well-developed, well nourished female who is awake, alert and oriented, in NAD. HEENT: [place]/AT, PERRL, EOMI.  Sclera and conjunctiva are clear.   OP is clear. Neck: supple, non-tender, no lymphadenopathy, thyromegaly. Heart: RRR, no murmur Lungs: normal effort, CTA Abdomen: normo-active bowel sounds, supple, non-tender, no mass or organomegaly. Extremities: no cyanosis, clubbing or edema. Skin: warm and dry without rash. Psychologic: good mood and appropriate affect, normal speech and behavior.    Assessment & Plan:  Depression controlled on sertraline.  Refill given.  Otherwise no new issues.

## 2014-11-20 ENCOUNTER — Other Ambulatory Visit: Payer: Self-pay

## 2014-11-20 DIAGNOSIS — R4589 Other symptoms and signs involving emotional state: Secondary | ICD-10-CM

## 2014-11-20 MED ORDER — SERTRALINE HCL 100 MG PO TABS: 100.0000 mg | ORAL_TABLET | Freq: Every day | ORAL | Status: DC

## 2015-01-04 ENCOUNTER — Other Ambulatory Visit: Payer: Self-pay | Admitting: Physician Assistant

## 2015-02-04 ENCOUNTER — Other Ambulatory Visit: Payer: Self-pay | Admitting: Physician Assistant

## 2015-04-07 ENCOUNTER — Other Ambulatory Visit: Payer: Self-pay | Admitting: Physician Assistant

## 2015-04-09 NOTE — Telephone Encounter (Signed)
Called and [facility] for pt to RTC or call w/f/up plan. Denied RF w/note for pt to contact provider.

## 2015-04-11 ENCOUNTER — Other Ambulatory Visit: Payer: Self-pay | Admitting: Physician Assistant

## 2015-07-28 ENCOUNTER — Ambulatory Visit (INDEPENDENT_AMBULATORY_CARE_PROVIDER_SITE_OTHER): Payer: Commercial Managed Care - PPO | Admitting: Family Medicine

## 2015-07-28 VITALS — BP 122/70 | HR 75 | Temp 97.7°F | Resp 18 | Ht <= 58 in | Wt 138.0 lb

## 2015-07-28 DIAGNOSIS — R112 Nausea with vomiting, unspecified: Secondary | ICD-10-CM | POA: Diagnosis not present

## 2015-07-28 DIAGNOSIS — H8113 Benign paroxysmal vertigo, bilateral: Secondary | ICD-10-CM

## 2015-07-28 DIAGNOSIS — R42 Dizziness and giddiness: Secondary | ICD-10-CM

## 2015-07-28 DIAGNOSIS — R4589 Other symptoms and signs involving emotional state: Secondary | ICD-10-CM

## 2015-07-28 DIAGNOSIS — F329 Major depressive disorder, single episode, unspecified: Secondary | ICD-10-CM

## 2015-07-28 LAB — POCT CBC
Granulocyte percent: 58.6 % (ref 37–80)
HCT, POC: 39.8 % (ref 37.7–47.9)
Hemoglobin: 13.9 g/dL (ref 12.2–16.2)
Lymph, poc: 3.5 — AB (ref 0.6–3.4)
MCH, POC: 30 pg (ref 27–31.2)
MCHC: 34.8 g/dL (ref 31.8–35.4)
MCV: 86.2 fL (ref 80–97)
MID (cbc): 0.5 (ref 0–0.9)
MPV: 8.6 fL (ref 0–99.8)
POC Granulocyte: 5.7 (ref 2–6.9)
POC LYMPH PERCENT: 36.1 %L (ref 10–50)
POC MID %: 5.3 % (ref 0–12)
Platelet Count, POC: 201 10*3/uL (ref 142–424)
RBC: 4.61 M/uL (ref 4.04–5.48)
RDW, POC: 13.8 %
WBC: 9.7 10*3/uL (ref 4.6–10.2)

## 2015-07-28 LAB — POCT URINALYSIS DIP (MANUAL ENTRY)
Bilirubin, UA: NEGATIVE
Blood, UA: NEGATIVE
Glucose, UA: NEGATIVE
Ketones, POC UA: NEGATIVE
Leukocytes, UA: NEGATIVE
Nitrite, UA: NEGATIVE
Protein Ur, POC: NEGATIVE
Spec Grav, UA: 1.02
Urobilinogen, UA: 0.2
pH, UA: 6

## 2015-07-28 LAB — POC MICROSCOPIC URINALYSIS (UMFC)

## 2015-07-28 LAB — POCT URINE PREGNANCY: Preg Test, Ur: NEGATIVE

## 2015-07-28 MED ORDER — MECLIZINE HCL 25 MG PO TABS: 25.0000 mg | ORAL_TABLET | Freq: Three times a day (TID) | ORAL | Status: DC | PRN

## 2015-07-28 MED ORDER — SERTRALINE HCL 100 MG PO TABS: ORAL_TABLET | ORAL | Status: DC

## 2015-07-28 NOTE — Patient Instructions (Signed)
Vrtigo posicional benigno (Benign Positional Vertigo) El vrtigo es la sensacin de que usted o todo lo que lo rodea se mueven cuando en realidad eso no sucede. El vrtigo posicional benigno es el tipo de vrtigo ms comn. La causa de este trastorno no es grave (es benigna). Algunos movimientos y determinadas posiciones pueden desencadenar el trastorno (es posicional). El vrtigo posicional benigno puede ser peligroso si ocurre mientras est haciendo algo que podra suponer un riesgo para usted y para los dems, por ejemplo, conduciendo un automvil.  CAUSAS En muchos de los [place], se desconoce la causa de este trastorno. Puede deberse a Passenger transport manager zona del odo interno que ayuda al cerebro a percibir el movimiento y a Neurosurgeon equilibrio. Esta alteracin puede deberse a una infeccin viral (laberintitis), a una lesin en la cabeza o a los movimientos reiterados. FACTORES DE RIESGO Es ms probable que esta afeccin se manifieste en:  Las mujeres.  [place] [id number]. SNTOMAS Generalmente, los sntomas de este trastorno se presentan al mover la cabeza o los ojos en diferentes direcciones. Pueden aparecer repentinamente y suelen durar menos de un minuto. Entre los sntomas se pueden incluir los siguientes:  Prdida del equilibrio y cadas.  Sensacin de estar dando vueltas o movindose.  Sensacin de que el entorno est dando vueltas o movindose.  Nuseas y vmitos.  Visin borrosa.  Mareos.  Movimientos oculares involuntarios (nistagmo). Los sntomas pueden ser leves y algo fastidiosos, o pueden ser graves e interferir en la vida cotidiana. Los episodios de vrtigo posicional benigno pueden repetirse (ser recurrentes) a lo largo del [place], y algunos movimientos pueden desencadenarlos. Los sntomas pueden mejorar con Physiological scientist. DIAGNSTICO Generalmente, este trastorno se diagnostica con una historia clnica y un examen fsico de la cabeza, el cuello y los  odos. Tal vez lo deriven a [place] en problemas de la garganta, la nariz y el odo (otorrinolaringlogo), o a uno que se especializa en trastornos del sistema nervioso (neurlogo). Pueden hacerle otros estudios, entre ellos:  Health visitor.  Tomografa computarizada.  Estudios de los [employer]. El mdico puede pedirle que cambie rpidamente de posicin mientras observa si se presentan sntomas de vrtigo posicional benigno, por ejemplo, nistagmo. Los movimientos oculares se pueden estudiar con una electronistagmografa (ENG), con estimulacin trmica, mediante la maniobra de [place] o con la prueba de rotacin.  Electroencefalograma (EEG). Este estudio registra la actividad elctrica del cerebro.  Pruebas de audicin. Kennis Carina, para tratar este trastorno, el mdico Nikolai Wilczak har movimientos especficos con la cabeza para que el odo interno se normalice. Manpower Inc casos son graves, tal vez haya que realizar una ciruga, pero esto no es frecuente. En algunos casos, el vrtigo posicional benigno se resuelve por s solo en el trmino de 2 o 4semanas. INSTRUCCIONES PARA EL CUIDADO EN EL HOGAR Seguridad  Muvase lentamente.No haga movimientos bruscos con el cuerpo o con la cabeza.  No conduzca.  No opere maquinaria pesada.  No haga ninguna tarea que podra ser peligrosa para usted o para Producer, television/film/video en caso de que ocurriera un episodio de vrtigo.  Si tiene dificultad para caminar o mantener el equilibrio, use un bastn para Consulting civil engineer estabilidad. Si se siente mareado o inestable, sintese de inmediato.  Reanude sus actividades normales como se lo haya indicado el mdico. Pregntele al mdico qu actividades son seguras para usted. Instrucciones generales  [employer]phi de venta libre y los recetados solamente como se lo haya indicado el mdico.  Evite algunas posiciones o determinados movimientos como se lo haya indicado el  mdico.  Beba suficiente lquido para mantener la orina clara o de color amarillo plido.  Concurra a todas las visitas de control como se lo haya indicado el mdico. Esto es importante. SOLICITE ATENCIN MDICA SI:  Lance Mussiene fiebre.  El trastorno [place] Springsempeora, o Genifer Lazenby aparecen sntomas nuevos.  Sus familiares o amigos advierten cambios en su comportamiento.  Las nuseas o los vmitos empeoran.  Tiene sensacin de hormigueo o de adormecimiento. SOLICITE ATENCIN MDICA DE INMEDIATO SI:  Tiene dificultad para hablar o para moverse.  Esta mareado todo Allied Waste Industriesel tiempo.  Se desmaya.  Tiene dolores de cabeza intensos.  Tiene debilidad en los brazos o las piernas.  Tiene cambios en la audicin o la visin.  Siente rigidez en el cuello.  Tiene sensibilidad a Statisticianla luz.   Esta informacin no tiene Theme park managercomo fin reemplazar el consejo del mdico. Asegrese de hacerle al mdico cualquier pregunta que tenga.   Document Released: 11/17/2008 Document Revised: 04/22/2015 Elsevier Interactive Patient Education 2016 [employer]vier [employer] [place]areos (Dizziness) Los mareos son un problema muy frecuente. Se trata de una sensacin de inestabilidad o de desvanecimiento. Puede sentir que se va a desmayar. Un mareo puede provocarle una lesin si se tropieza o se cae. Las Dealerpersonas de todas las edades pueden sufrir Research scientist (life sciences)mareos, Biomedical engineerpero es ms frecuente en los adultos [place]-Cascademayores. Esta afeccin puede tener muchas causas, entre las que se pueden Assurantmencionar los medicamentos, la deshidratacin y [place]las enfermedades. INSTRUCCIONES PARA EL CUIDADO EN EL HOGAR Estas indicaciones pueden ayudarlo con el trastorno: Comida y bebida  Beba suficiente lquido para Pharmacologistmantener la orina clara o de color amarillo plido. Esto evita la deshidratacin. Trate de beber ms lquidos transparentes, como agua.  No beba alcohol.  Limite el consumo de cafena si el mdico se lo indica.  Limite el consumo de sal si el mdico se lo indica. Actividad  Evite los  movimientos rpidos.  Levntese de las sillas con lentitud y apyese hasta sentirse bien.  Por la maana, sintese primero a un lado de la cama. Cuando se sienta bien, pngase lentamente de [address] Avepie mientras se sostiene de algo, hasta que sepa que ha logrado el equilibrio.  Mueva las piernas con frecuencia si debe estar de pie en un lugar durante mucho tiempo. Mientras est de pie, contraiga y relaje los msculos de las piernas.  No conduzca vehculos ni opere maquinaria pesada si se siente mareado.  Evite agacharse si se siente mareado. En su casa, coloque los objetos de modo que Kayelyn Lemon resulte fcil alcanzarlos sin Public librarianagacharse. Estilo de vida  No consuma ningn producto que contenga tabaco, lo que incluye cigarrillos, tabaco de Theatre managermascar o Administrator, Civil Servicecigarrillos electrnicos. Si necesita ayuda para dejar de fumar, consulte al American Expressmdico.  Trate de reducir el nivel de estrs practicando actividades como el yoga o la meditacin. Hable con el mdico si necesita ayuda. Instrucciones generales  Controle sus mareos para ver si hay cambios.  Tome los medicamentos solamente como se lo haya indicado el mdico. Hable con el mdico si cree que los medicamentos que est tomando son la causa de sus mareos.  Infrmele a un amigo o a un familiar si se siente mareado. Pdale a esta persona que llame al mdico si observa cambios en su comportamiento.  Concurra a todas las visitas de control como se lo haya indicado el mdico. Esto es importante. SOLICITE ATENCIN MDICA SI:  Los American Expressmareos persisten.  Los Golden West Financialmareos o la sensacin de Production assistant, radiodesvanecimiento empeoran.  Siente nuseas.  Ha perdido la audicin.  Aparecen nuevos sntomas.  Cuando est de pie se siente inestable o que la habitacin da vueltas. SOLICITE ATENCIN MDICA DE INMEDIATO SI:  Vomita o tiene diarrea y no puede comer ni beber nada.  Tiene dificultad para hablar, para caminar, para tragar o para Boeing, las manos o las piernas.  Siente una debilidad  generalizada.  No piensa con claridad o tiene dificultades para armar oraciones. Es posible que un amigo o un familiar adviertan que esto ocurre.  Tiene dolor de pecho, dolor abdominal, sudoracin o Company secretary.  Hay cambios en la visin.  Observa un sangrado.  Tiene dolores de [place].  Tiene dolor o rigidez en el cuello.  Tiene fiebre.   Esta informacin no tiene Theme park manager el consejo del mdico. Asegrese de hacerle al mdico cualquier pregunta que tenga.   Document Released: 08/01/2005 Document Revised: 12/16/2014 Elsevier Interactive Patient Education [employer]

## 2015-07-28 NOTE — Progress Notes (Signed)
 Chief Complaint:  Chief Complaint  Patient presents with  . Dizziness    x2 days   . Emesis  . Flu Vaccine    HPI: Patty Howell is a 44 y.o. female who reports to [facility] today complaining of dizziness for last 2 days , comes and goes , lasts until she takes advil. She has some HA  She has been without zoloft for 3 months because she ran out of her meds. When she takes the zoloft she feels better. She stopped it sudddenly because she had no pills left. When she was on the pill she felt better.  She has been without meds for 3 months. Sh ehas had some intermittent dizziness in the last couple of days. Denies any other neuro sxs. Denies HA, No flank pain, no fevers, chills. She had one episode of nausea and vomitus, she is not pregnant. She has had dizziness similar to this before but usually does not last long. Has not taken anything for this. Usually with head movement. Not associated with sitting or standing. She denies any urinary symptoms. She had a dx of HTN but has been without her blood pressure meds for about 1 year and feels fine. She denies any HAs or elevated BP in the recent months, has been eating better and exercising  Wt Readings from Last 3 Encounters:  07/28/15 138 lb (62.596 kg)  11/05/13 139 lb (63.05 kg)  08/10/13 137 lb 12.8 oz (62.506 kg)    BP Readings from Last 3 Encounters:  07/28/15 122/70  11/05/13 150/92  08/10/13 158/100     Past Medical History  Diagnosis Date  . Asthma   . Hypertension   . Depression    History reviewed. No pertinent past surgical history. Social History   Social History  . Marital Status: Single    Spouse Name: N/A  . Number of Children: N/A  . Years of Education: N/A   Social History Main Topics  . Smoking status: Never Smoker   . Smokeless tobacco: Never Used  . Alcohol Use: No  . Drug Use: No  . Sexual Activity: Not Asked   Other Topics Concern  . None   Social History Narrative   Family History  Problem  Relation Age of Onset  . Hypertension Mother    No Known Allergies Prior to Admission medications   Medication Sig Start Date End Date Taking? Authorizing Provider  sertraline (ZOLOFT) 100 MG tablet TAKE ONE TABLET BY MOUTH ONCE DAILY REFILLS  WITHOUT  OFFICE  VISIT 02/05/15  Yes Morrell Riddle, PA-C  albuterol (PROVENTIL HFA;VENTOLIN HFA) 108 (90 BASE) MCG/ACT inhaler Inhale 2 puffs into the lungs every 6 (six) hours as needed for wheezing or shortness of breath. Patient not taking: Reported on 07/28/2015 08/10/13   Tonye Pearson, MD  FLUoxetine (PROZAC) 20 MG tablet Take 1 tablet (20 mg total) by mouth daily. Patient not taking: Reported on 07/28/2015 10/31/11 01/29/12  Elvina Sidle, MD  lisinopril (PRINIVIL,ZESTRIL) 10 MG tablet Take 1 tablet (10 mg total) by mouth 2 (two) times daily. Patient not taking: Reported on 07/28/2015 11/20/12    P , DO  meloxicam (MOBIC) 15 MG tablet Take 1 tablet (15 mg total) by mouth daily. Patient not taking: Reported on 07/28/2015 08/10/13   Tonye Pearson, MD     ROS: The patient denies fevers, chills, night sweats, unintentional weight loss, chest pain, palpitations, wheezing, dyspnea on exertion, nausea, vomiting, abdominal pain, dysuria, hematuria, melena,  numbness, weakness, or tingling.   All other systems have been reviewed and were otherwise negative with the exception of those mentioned in the HPI and as above.    PHYSICAL EXAM: Filed Vitals:   07/28/15 1707  BP: 122/70  Pulse: 75  Temp: 97.7 F (36.5 C)  Resp: 18   Body mass index is 28.85 kg/(m^2).   General: Alert, no acute distress, Hispanic female  HEENT:  Normocephalic, atraumatic, oropharynx patent. EOMI, PERRLA, TM normal, fundoscopic normal  Cardiovascular:  Regular rate and rhythm, no rubs murmurs or gallops.  No Carotid bruits, radial pulse intact. No pedal edema.  Respiratory: Clear to auscultation bilaterally.  No wheezes, rales, or rhonchi.  No cyanosis, no  use of accessory musculature Abdominal: No organomegaly, abdomen is soft and non-tender, positive bowel sounds. No masses. Skin: No rashes. Neurologic: Facial musculature symmetric. Psychiatric: Patient acts appropriately throughout our interaction. Lymphatic: No cervical lymphadenopathy Musculoskeletal: Gait intact. No edema, tenderness Dix Hallpike + CN 2-12 grossly normal , neg Romberg 5/5 strength, 2/2 DTRs  LABS: Results for orders placed or performed in visit on 07/28/15  POCT CBC  Result Value Ref Range   WBC 9.7 4.6 - 10.2 K/uL   Lymph, poc 3.5 (A) 0.6 - 3.4   POC LYMPH PERCENT 36.1 10 - 50 %L   MID (cbc) 0.5 0 - 0.9   POC MID % 5.3 0 - 12 %M   POC Granulocyte 5.7 2 - 6.9   Granulocyte percent 58.6 37 - 80 %G   RBC 4.61 4.04 - 5.48 M/uL   Hemoglobin 13.9 12.2 - 16.2 g/dL   HCT, POC 78.239.8 95.637.7 - 47.9 %   MCV 86.2 80 - 97 fL   MCH, POC 30.0 27 - 31.2 pg   MCHC 34.8 31.8 - 35.4 g/dL   RDW, POC 21.313.8 %   Platelet Count, POC 201 142 - 424 K/uL   MPV 8.6 0 - 99.8 fL  POCT urinalysis dipstick  Result Value Ref Range   Color, UA yellow yellow   Clarity, UA clear clear   Glucose, UA negative negative   Bilirubin, UA negative negative   Ketones, POC UA negative negative   Spec Grav, UA 1.020    Blood, UA negative negative   pH, UA 6.0    Protein Ur, POC negative negative   Urobilinogen, UA 0.2    Nitrite, UA Negative Negative   ukocytes, UA Negative Negative  POCT Microscopic Urinalysis ([facility])  Result Value Ref Range   WBC,UR,HPF,POC None None WBC/hpf   RBC,UR,HPF,POC None None RBC/hpf   Bacteria None None, Too numerous to count   Mucus Present (A) Absent   Epithelial Cells, UR Per Microscopy Few (A) None, Too numerous to count cells/hpf  POCT urine pregnancy  Result Value Ref Range   Preg Test, Ur Negative Negative     EKG/XRAY:   Primary read interpreted by Dr. Conley Rolls at [facility].   ASSESSMENT/PLAN: Encounter Diagnoses  Name Primary?  . Dizziness and giddiness  Yes  . Depressed affect   . Non-intractable vomiting with nausea, unspecified vomiting type   . BPPV (benign paroxysmal positional vertigo), bilateral    Orthostatics normal Cont to monitor BP Refilled Zoloft  Rx Meclizine Labs pending Fu prn   Gross sideeffects, risk and benefits, and alternatives of medications d/w patient. Patient is aware that all medications have potential sideeffects and we are unable to predict every sideeffect or drug-drug interaction that may occur.    DO  07/28/2015 8:45  PM

## 2015-07-29 LAB — COMPLETE METABOLIC PANEL WITH GFR
Alkaline Phosphatase: 76 U/L (ref 33–115)
CO2: 25 mmol/L (ref 20–31)
Calcium: 9.9 mg/dL (ref 8.6–10.2)
Creat: 0.6 mg/dL (ref 0.50–1.10)
GFR, Est African American: >89 mL/min (ref >=60)
GFR, Est Non African American: >89 mL/min (ref >=60)
Total Bilirubin: 0.3 mg/dL (ref 0.2–1.2)
Total Protein: 8.2 g/dL — ABNORMAL HIGH (ref 6.1–8.1)

## 2015-07-29 LAB — COMPLETE METABOLIC PANEL WITHOUT GFR
ALT: 18 U/L (ref 6–29)
AST: 18 U/L (ref 10–30)
Albumin: 4.2 g/dL (ref 3.6–5.1)
BUN: 15 mg/dL (ref 7–25)
Chloride: 102 mmol/L (ref 98–110)
Glucose, Bld: 91 mg/dL (ref 65–99)
Potassium: 4 mmol/L (ref 3.5–5.3)
Sodium: 138 mmol/L (ref 135–146)

## 2015-10-29 ENCOUNTER — Encounter: Payer: Self-pay | Admitting: Family Medicine

## 2016-08-11 ENCOUNTER — Ambulatory Visit (INDEPENDENT_AMBULATORY_CARE_PROVIDER_SITE_OTHER): Payer: Commercial Managed Care - PPO | Admitting: Family Medicine

## 2016-08-11 VITALS — BP 142/82 | HR 69 | Temp 98.4°F | Ht <= 58 in | Wt 140.0 lb

## 2016-08-11 DIAGNOSIS — R42 Dizziness and giddiness: Secondary | ICD-10-CM | POA: Diagnosis not present

## 2016-08-11 DIAGNOSIS — R030 Elevated blood-pressure reading, without diagnosis of hypertension: Secondary | ICD-10-CM

## 2016-08-11 MED ORDER — SCOPOLAMINE 1 MG/3DAYS TD PT72: 1.0000 | MEDICATED_PATCH | TRANSDERMAL | 12 refills | Status: DC

## 2016-08-11 NOTE — Patient Instructions (Addendum)
Scopolamine patch apply one patch behind the ear once every 3 days.  Return for care if blood pressure readings are greater than 140/80.  I would like you to return in 2 weeks for blood pressure recheck.    IF you received an x-ray today, you will receive an invoice from [facility]. Please contact [facility] at [phone] with questions or concerns regarding your invoice.   IF you received labwork today, you will receive an invoice from [facility]. Please contact [facility] at [phone] with questions or concerns regarding your invoice.   Our billing staff will not be able to assist you with questions regarding bills from these companies.  You will be contacted with the lab results as soon as they are available. The fastest way to get your results is to activate your My Char   t account. Instructions are located on the last page of this paperwork. If you have not heard from us regarding the results in 2 weeks, please contact this office.     Hypertension Hypertension is another name for high blood pressure. High blood pressure forces your heart to work harder to pump blood. A blood pressure reading has two numbers, which includes a higher number over a lower number (example: 110/72). Follow these instructions at home:  Have your blood pressure rechecked by your doctor.  Only take medicine as told by your doctor. Follow the directions carefully. The medicine does not work as well if you skip doses. Skipping doses also puts you at risk for problems.  Do not smoke.  Monitor your blood pressure at home as told by your doctor. Contact a doctor if:  You think you are having a reaction to the medicine you are taking.  You have repeat headaches or feel dizzy.  You have puffiness (swelling) in your ankles.  You have trouble with your vision. Get help right away if:  You get a very bad headache and are confused.  You feel weak, numb, or faint.  You get chest  or belly (abdominal) pain.  You throw up (vomit).  You cannot breathe very well. This information is not intended to replace advice given to you by your health care provider. Make sure you discuss any questions you have with your health care provider. Document Released: 01/18/2008 Document Revised: 01/07/2016 Document Reviewed: 05/24/2013 Elsevier Interactive Patient Education  2017 Elsevier Inc.      Dizziness Dizziness is a common problem. It makes you feel unsteady or lightheaded. You may feel like you are about to pass out (faint). Dizziness can lead to injury if you stumble or fall. Anyone can get dizzy, but dizziness is more common in older adults. This condition can be caused by a number of things, including:  Medicines.  Dehydration.  Illness. Follow these instructions at home: Following these instructions may help with your condition: Eating and drinking  Drink enough fluid to keep your pee (urine) clear or pale yellow. This helps to keep you from getting dehydrated. Try to drink more clear fluids, such as water.  Do not drink alcohol.  Limit how much caffeine you drink or eat if told by your doctor.  Limit how much salt you drink or eat if told by your doctor. Activity  Avoid making quick movements.  When you stand up from sitting in a chair, steady yourself until you feel okay.  In the morning, first sit up on the side of the bed. When you feel okay, stand slowly while you hold onto something.  Do this until you know that your balance is fine.  Move your legs often if you need to stand in one place for a long time. Tighten and relax your muscles in your legs while you are standing.  Do not drive or use heavy machinery if you feel dizzy.  Avoid bending down if you feel dizzy. Place items in your home so that they are easy for you to reach without leaning over. Lifestyle  Do not use any tobacco products, including cigarettes, chewing tobacco, or electronic  cigarettes. If you need help quitting, ask your doctor.  Try to lower your stress level, such as with yoga or meditation. Talk with your doctor if you need help. General instructions  Watch your dizziness for any changes.  Take medicines only as told by your doctor. Talk with your doctor if you think that your dizziness is caused by a medicine that you are taking.  Tell a friend or a family member that you are feeling dizzy. If he or she notices any changes in your behavior, have this person call your doctor.  Keep all follow-up visits as told by your doctor. This is important. Contact a doctor if:  Your dizziness does not go away.  Your dizziness or light-headedness gets worse.  You feel sick to your stomach (nauseous).  You have trouble hearing.  You have new symptoms.  You are unsteady on your feet or you feel like the room is spinning. Get help right away if:  You throw up (vomit) or have diarrhea and are unable to eat or drink anything.  You have trouble:  Talking.  Walking.  Swallowing.  Using your arms, hands, or legs.  You feel generally weak.  You are not thinking clearly or you have trouble forming sentences. It may take a friend or family member to notice this.  You have:  Chest pain.  Pain in your belly (abdomen).  Shortness of breath.  Sweating.  Your vision changes.  You are bleeding.  You have a headache.  You have neck pain or a stiff neck.  You have a fever. This information is not intended to replace advice given to you by your health care provider. Make sure you discuss any questions you have with your health care provider. Document Released: 07/21/2011 Document Revised: 01/07/2016 Document Reviewed: 07/28/2014 Elsevier Interactive Patient Education  2017 [employer]

## 2016-08-11 NOTE — Progress Notes (Signed)
Patient ID: Patty Howell, female    DOB: June 22, 1971, 45 y.o.   MRN: [id number]  PCP: Tally DueGUEST, CHRIS WARREN, MD  Chief Complaint  Patient presents with  . Dizziness  . Emesis    Subjective:  HPI  45 year old female presents for evaluation of dizziness with nausea.  Pt is spanish speaking only and is accompanied by her daughter that is translating during today's visit. Reports dizziness has been a chronic problems for several years. Previously prescribed Meclizine, took medication without improvement of symptoms. 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 Reports episodic dizziness with headache developing afterwards. Dizziness is not related to positional changes. Blurring of vision occurs occasionally with dizziness. Patient has never been further evaluated by neurology or participated in vestibular rehabilitation.  Blood pressure is elevated today and is noted as prior problem in her hx. She denies any prior knowledge or dx of hypertension. Doesn't routinely check blood pressure.   Social History   Social History  . Marital status: Single    Spouse name: N/A  . Number of children: N/A  . Years of education: N/A   Occupational History  . Not on file.   Social History Main Topics  . Smoking status: Never Smoker  . Smokeless tobacco: Never Used  . Alcohol use No  . Drug use: No  . Sexual activity: Not on file   Other Topics Concern  . Not on file   Social History Narrative  . No narrative on file    Family History  Problem Relation Age of Onset  . Hypertension Mother    Review of Systems  HPI  Patient Active Problem List   Diagnosis Date Noted  . Depressed affect 02/04/2013  . HA (headache) 02/04/2013  . Hypertension 10/23/2011    No Known Allergies  Prior to Admission medications   Medication Sig Start Date End Date Taking? Authorizing Provider  sertraline (ZOLOFT) 100 MG tablet TAKE ONE TABLET BY MOUTH ONCE DAILY 07/28/15  Yes Thao P Le, DO  albuterol  (PROVENTIL HFA;VENTOLIN HFA) 108 (90 BASE) MCG/ACT inhaler Inhale 2 puffs into the lungs every 6 (six) hours as needed for wheezing or shortness of breath. Patient not taking: Reported on 08/11/2016 08/10/13   Tonye Pearsonobert P Doolittle, MD  meclizine (ANTIVERT) 25 MG tablet Take 1 tablet (25 mg total) by mouth 3 (three) times daily as needed for dizziness. Patient not taking: Reported on 08/11/2016 07/28/15   Lenell Antuhao P Le, DO    Past Medical, Surgical Family and Social History reviewed and updated.    Objective:   Today's Vitals   08/11/16 1349  BP: (!) 142/82  Pulse: 69  Temp: 98.4 F (36.9 C)  TempSrc: Oral  SpO2: 100%  Weight: 140 lb (63.5 kg)  Height: 4\' 10"  (1.473 m)    Wt Readings from Last 3 Encounters:  08/11/16 140 lb (63.5 kg)  07/28/15 138 lb (62.6 kg)  11/05/13 139 lb (63 kg)    Physical Exam  Constitutional: She is oriented to person, place, and time. She appears well-developed and well-nourished.  HENT:  Head: Normocephalic and atraumatic.  Eyes: Conjunctivae and EOM are normal. Pupils are equal, round, and reactive to light.  Neck: Normal range of motion. Neck supple. No thyromegaly present.  Cardiovascular: Normal rate, regular rhythm, normal heart sounds and intact distal pulses.   Pulmonary/Chest: Effort normal and breath sounds normal.  Lymphadenopathy:    She has no cervical adenopathy.  Neurological: She is alert and oriented to person, place, and  time. No cranial nerve deficit. Coordination normal.  Negative Dill Hallpike. Dizziness was not reproducible with positional head movements or raising from a lying to sitting position.  Skin: Skin is warm and dry.  Psychiatric: She has a normal mood and affect. Her behavior is normal.      Assessment & Plan:  1. Dizziness -Scopolamine patches, every 3 days to prevent dizziness.  2. Elevated blood pressure reading -Check blood pressure twice weekly. If readings continue to be greater than 140/80, return for care  sooner than 2 weeks. Otherwise, return for care in 2 weeks for a blood pressure follow-up.    Godfrey PickKimberly S. Tiburcio PeaHarris, MSN, FNP-C [facility]

## 2016-08-20 ENCOUNTER — Emergency Department (HOSPITAL_COMMUNITY)
Admission: EM | Admit: 2016-08-20 | Discharge: 2016-08-21 | Disposition: A | Discharge status: Home / Self Care | Payer: Commercial Managed Care - PPO | Attending: Emergency Medicine | Admitting: Emergency Medicine

## 2016-08-20 ENCOUNTER — Emergency Department (HOSPITAL_COMMUNITY): Payer: Commercial Managed Care - PPO

## 2016-08-20 DIAGNOSIS — Z79899 Other long term (current) drug therapy: Secondary | ICD-10-CM | POA: Insufficient documentation

## 2016-08-20 DIAGNOSIS — R51 Headache: Secondary | ICD-10-CM | POA: Diagnosis not present

## 2016-08-20 DIAGNOSIS — J45909 Unspecified asthma, uncomplicated: Secondary | ICD-10-CM | POA: Diagnosis not present

## 2016-08-20 DIAGNOSIS — I10 Essential (primary) hypertension: Secondary | ICD-10-CM | POA: Diagnosis not present

## 2016-08-20 DIAGNOSIS — R42 Dizziness and giddiness: Secondary | ICD-10-CM | POA: Diagnosis not present

## 2016-08-20 LAB — CBC
HEMATOCRIT: 40.6 % (ref 36.0–46.0)
HEMOGLOBIN: 13.5 g/dL (ref 12.0–15.0)
MCH: 29.4 pg (ref 26.0–34.0)
MCHC: 33.3 g/dL (ref 30.0–36.0)
MCV: 88.5 fL (ref 78.0–100.0)
Platelets: 225 10*3/uL (ref 150–400)
RBC: 4.59 MIL/uL (ref 3.87–5.11)
RDW: 14.2 % (ref 11.5–15.5)
WBC: 9.6 10*3/uL (ref 4.0–10.5)

## 2016-08-20 LAB — I-STAT BETA HCG BLOOD, ED (MC, WL, AP ONLY): I-stat hCG, quantitative: <5 m[IU]/mL (ref <5)

## 2016-08-20 LAB — URINALYSIS, ROUTINE W REFLEX MICROSCOPIC
BILIRUBIN URINE: NEGATIVE
Glucose, UA: NEGATIVE mg/dL
HGB URINE DIPSTICK: NEGATIVE
Ketones, ur: NEGATIVE mg/dL
Leukocytes, UA: NEGATIVE
Nitrite: NEGATIVE
PH: 6 (ref 5.0–8.0)
Protein, ur: NEGATIVE mg/dL
SPECIFIC GRAVITY, URINE: 1.025 (ref 1.005–1.030)

## 2016-08-20 LAB — COMPREHENSIVE METABOLIC PANEL
ALT: 23 U/L (ref 14–54)
AST: 23 U/L (ref 15–41)
Albumin: 4.3 g/dL (ref 3.5–5.0)
Alkaline Phosphatase: 75 U/L (ref 38–126)
Anion gap: 9 (ref 5–15)
BUN: 12 mg/dL (ref 6–20)
CO2: 26 mmol/L (ref 22–32)
Calcium: 9.7 mg/dL (ref 8.9–10.3)
Chloride: 101 mmol/L (ref 101–111)
Creatinine, Ser: 0.61 mg/dL (ref 0.44–1.00)
GFR calc Af Amer: >60 mL/min (ref >60)
GFR calc non Af Amer: >60 mL/min (ref >60)
Glucose, Bld: 105 mg/dL — ABNORMAL HIGH (ref 65–99)
Potassium: 3.5 mmol/L (ref 3.5–5.1)
Sodium: 136 mmol/L (ref 135–145)
Total Bilirubin: 0.4 mg/dL (ref 0.3–1.2)
Total Protein: 8.6 g/dL — ABNORMAL HIGH (ref 6.5–8.1)

## 2016-08-20 LAB — LIPASE, BLOOD: Lipase: 37 U/L (ref 11–51)

## 2016-08-20 MED ORDER — ONDANSETRON 4 MG PO TBDP
4.0000 mg | ORAL_TABLET | Freq: Once | ORAL | Status: AC | PRN
  Administered 2016-08-20: 4 mg via ORAL
  Filled 2016-08-20: qty 1

## 2016-08-20 NOTE — ED Notes (Signed)
PT in CT.

## 2016-08-20 NOTE — ED Provider Notes (Signed)
[facility]-EMERGENCY DEPT Provider Note   CSN: [id number] Arrival date & time: 08/20/16  2026     History   Chief Complaint Chief Complaint  Patient presents with  . Emesis  . Dizziness    HPI Patty Howell is a 46 y.o. female.  The history is provided by the patient. No language interpreter was used.    Patty Howell is a 46 y.o. female who presents to the Emergency Department complaining of nausea, dizziness.  History is provided by the patient and family member. Over the last 2 weeks she has been experiencing intermittent episodes of headaches, nausea, dizziness. She has occasional vomiting with epigastric pain. She reports generalized weakness but no focal weakness. She saw her PCP 2 weeks ago and was started on scopolamine patch with no improvement in her symptoms. Evaluated at that time and they planned to watch her blood pressures and recheck it in a few days. At home her blood pressures have been in the 170s systolic when she has episodes of headache and nausea.  Past Medical History:  Diagnosis Date  . Asthma   . Depression   . Hypertension     Patient Active Problem List   Diagnosis Date Noted  . Depressed affect 02/04/2013  . HA (headache) 02/04/2013  . Hypertension 10/23/2011    No past surgical history on file.  OB History    No data available       Home Medications    Prior to Admission medications   Medication Sig Start Date End Date Taking? Authorizing Provider  acetaminophen (TYLENOL) 500 MG tablet Take 1,000 mg by mouth every 6 (six) hours as needed for moderate pain.   Yes Historical Provider, MD  albuterol (PROVENTIL HFA;VENTOLIN HFA) 108 (90 BASE) MCG/ACT inhaler Inhale 2 puffs into the lungs every 6 (six) hours as needed for wheezing or shortness of breath. Patient not taking: Reported on 08/11/2016 08/10/13   Tonye Pearsonobert P Doolittle, MD  butalbital-acetaminophen-caffeine (FIORICET, ESGIC) 939358798450-325-40 MG tablet Take 1-2 tablets by mouth every 6 (six) hours as  needed for headache. 08/21/16 08/21/17  Tilden FossaElizabeth Neiko Trivedi, MD  meclizine (ANTIVERT) 25 MG tablet Take 1 tablet (25 mg total) by mouth 3 (three) times daily as needed for dizziness. Patient not taking: Reported on 08/11/2016 07/28/15   Thao P Le, DO  ondansetron (ZOFRAN) 4 MG tablet Take 1 tablet (4 mg total) by mouth every 8 (eight) hours as needed for nausea or vomiting. 08/21/16   Tilden FossaElizabeth Jameica Couts, MD  scopolamine (TRANSDERM-SCOP) 1 MG/3DAYS Place 1 patch (1.5 mg total) onto the skin every 3 (three) days. Patient not taking: Reported on 08/20/2016 08/11/16   Doyle AskewKimberly Stephenia Harris, FNP  sertraline (ZOLOFT) 100 MG tablet TAKE ONE TABLET BY MOUTH ONCE DAILY Patient not taking: Reported on 08/20/2016 07/28/15   Lenell Antuhao P Le, DO    Family History Family History  Problem Relation Age of Onset  . Hypertension Mother     Social History Social History  Substance Use Topics  . Smoking status: Never Smoker  . Smokeless tobacco: Never Used  . Alcohol use No     Allergies   Patient has no known allergies.   Review of Systems Review of Systems  All other systems reviewed and are negative.    Physical Exam Updated Vital Signs BP 120/69   Pulse 94   Temp 97.4 F (36.3 C) (Oral)   Resp 18   LMP 08/11/2016   SpO2 99%   Physical Exam  Constitutional: She is oriented to  person, place, and time. She appears well-developed and well-nourished.  HENT:  Head: Normocephalic and atraumatic.  Right Ear: External ear normal.  Left Ear: External ear normal.  Eyes: EOM are normal. Pupils are equal, round, and reactive to light.  Cardiovascular: Normal rate and regular rhythm.   No murmur heard. Pulmonary/Chest: Effort normal and breath sounds normal. No respiratory distress.  Abdominal: Soft. There is no tenderness. There is no rebound and no guarding.  Musculoskeletal: She exhibits no edema or tenderness.  Neurological: She is alert and oriented to person, place, and time. No cranial nerve deficit.    Skin: Skin is warm and dry.  Psychiatric: She has a normal mood and affect. Her behavior is normal.  Nursing note and vitals reviewed.    ED Treatments / Results  Labs (all labs ordered are listed, but only abnormal results are displayed) Labs Reviewed  COMPREHENSIVE METABOLIC PANEL - Abnormal; Notable for the following:       Result Value   Glucose, Bld 105 (*)    Total Protein 8.6 (*)    All other components within normal limits  LIPASE, BLOOD  CBC  URINALYSIS, ROUTINE W REFLEX MICROSCOPIC  I-STAT BETA HCG BLOOD, ED ([facility], [facility], [facility] ONLY)    EKG  EKG Interpretation None       Radiology Ct Head Wo Contrast  Result Date: 08/20/2016 CLINICAL DATA:  46 y/o  F; hypertension, dizziness, and vomiting. EXAM: CT HEAD WITHOUT CONTRAST TECHNIQUE: Contiguous axial images were obtained from the base of the skull through the vertex without intravenous contrast. COMPARISON:  None. FINDINGS: Brain: No evidence of acute infarction, hemorrhage, hydrocephalus, extra-axial collection or mass lesion/mass effect. Vascular: No hyperdense vessel or unexpected calcification. Skull: Normal. Negative for fracture or focal lesion. Sinuses/Orbits: No acute finding. Other: High-riding left jugular bulb. IMPRESSION: No acute intracranial abnormality identified. Unremarkable CT of the head. Electronically Signed   By: Mitzi Hansen M.D.   On: 08/20/2016 23:23    Procedures Procedures (including critical care time)  Medications Ordered in ED Medications  ondansetron (ZOFRAN-ODT) disintegrating tablet 4 mg (4 mg Oral Given 08/20/16 2048)  butalbital-acetaminophen-caffeine (FIORICET, ESGIC) 50-325-40 MG per tablet 2 tablet (2 tablets Oral Given 08/21/16 0032)  meclizine (ANTIVERT) tablet 25 mg (25 mg Oral Given 08/21/16 0032)     Initial Impression / Assessment and Plan / ED Course  I have reviewed the triage vital signs and the nursing notes.  Pertinent labs & imaging results that were available  during my care of the patient were reviewed by me and considered in my medical decision making (see chart for details).  Clinical Course     Patient here for evaluation of nausea, dizziness, headaches intermittently for the last several weeks. She has a nonfocal neurologic examination. Presentation is not consistent with CVA, subarachnoid hemorrhage, dural sinus thrombosis, meningitis. No evidence of hypertensive urgency. Discussed with patient home care for headache and dizziness. Also discussed PCP follow-up and return precautions.  Final Clinical Impressions(s) / ED Diagnoses   Final diagnoses:  Dizziness    New Prescriptions Discharge Medication List as of 08/21/2016 12:11 AM    START taking these medications   Details  butalbital-acetaminophen-caffeine (FIORICET, ESGIC) 50-325-40 MG tablet Take 1-2 tablets by mouth every 6 (six) hours as needed for headache., Starting Sun 08/21/2016, Until Mon 08/21/2017, Print    ondansetron (ZOFRAN) 4 MG tablet Take 1 tablet (4 mg total) by mouth every 8 (eight) hours as needed for nausea or vomiting., Starting Sun 08/21/2016,  Print         Tilden Fossa, MD 08/21/16 1536

## 2016-08-20 NOTE — ED Notes (Signed)
Pt c/o emesis since yesterday, dizziness, and hypertension.  Pt took blood pressure at home and it was elevated. Pt has had dizziness symptoms for a while now, but emesis is new.

## 2016-08-21 DIAGNOSIS — R42 Dizziness and giddiness: Secondary | ICD-10-CM | POA: Diagnosis not present

## 2016-08-21 DIAGNOSIS — J45909 Unspecified asthma, uncomplicated: Secondary | ICD-10-CM | POA: Diagnosis not present

## 2016-08-21 DIAGNOSIS — I10 Essential (primary) hypertension: Secondary | ICD-10-CM | POA: Diagnosis not present

## 2016-08-21 MED ORDER — MECLIZINE HCL 25 MG PO TABS
25.0000 mg | ORAL_TABLET | Freq: Once | ORAL | Status: AC
  Administered 2016-08-21: 25 mg via ORAL
  Filled 2016-08-21: qty 1

## 2016-08-21 MED ORDER — BUTALBITAL-APAP-CAFFEINE 50-325-40 MG PO TABS
2.0000 | ORAL_TABLET | Freq: Once | ORAL | Status: AC
  Administered 2016-08-21: 2 via ORAL
  Filled 2016-08-21: qty 2

## 2016-08-21 MED ORDER — BUTALBITAL-APAP-CAFFEINE 50-325-40 MG PO TABS: 1.0000 | ORAL_TABLET | Freq: Four times a day (QID) | ORAL | 0 refills | Status: AC | PRN

## 2016-08-21 MED ORDER — ONDANSETRON HCL 4 MG PO TABS: 4.0000 mg | ORAL_TABLET | Freq: Three times a day (TID) | ORAL | 0 refills | Status: DC | PRN

## 2016-08-22 ENCOUNTER — Encounter: Payer: Self-pay | Admitting: Physician Assistant

## 2016-08-22 ENCOUNTER — Ambulatory Visit (INDEPENDENT_AMBULATORY_CARE_PROVIDER_SITE_OTHER): Payer: Commercial Managed Care - PPO | Admitting: Physician Assistant

## 2016-08-22 VITALS — BP 108/80 | HR 93 | Temp 97.8°F | Resp 16 | Ht <= 58 in | Wt 138.6 lb

## 2016-08-22 DIAGNOSIS — R42 Dizziness and giddiness: Secondary | ICD-10-CM | POA: Diagnosis not present

## 2016-08-22 DIAGNOSIS — R112 Nausea with vomiting, unspecified: Secondary | ICD-10-CM | POA: Diagnosis not present

## 2016-08-22 DIAGNOSIS — I951 Orthostatic hypotension: Secondary | ICD-10-CM | POA: Diagnosis not present

## 2016-08-22 DIAGNOSIS — A09 Infectious gastroenteritis and colitis, unspecified: Secondary | ICD-10-CM | POA: Diagnosis not present

## 2016-08-22 DIAGNOSIS — R197 Diarrhea, unspecified: Secondary | ICD-10-CM

## 2016-08-22 LAB — POCT URINALYSIS DIP (MANUAL ENTRY)
Bilirubin, UA: NEGATIVE
GLUCOSE UA: NEGATIVE
Ketones, POC UA: NEGATIVE
NITRITE UA: NEGATIVE
PH UA: 5
RBC UA: NEGATIVE
Spec Grav, UA: 1.025
UROBILINOGEN UA: 0.2

## 2016-08-22 MED ORDER — PROMETHAZINE HCL 25 MG PO TABS: 12.5000 mg | ORAL_TABLET | Freq: Three times a day (TID) | ORAL | 0 refills | Status: DC | PRN

## 2016-08-22 MED ORDER — PROMETHAZINE HCL 25 MG/ML IJ SOLN: 25.0000 mg | INTRAMUSCULAR | Status: AC

## 2016-08-22 MED ORDER — PROMETHAZINE HCL 25 MG/ML IJ SOLN
25.0000 mg | INTRAMUSCULAR | Status: AC
  Administered 2016-08-22: 25 mg via INTRAMUSCULAR

## 2016-08-22 NOTE — Progress Notes (Signed)
08/22/2016 6:39 PM   DOB: 08/03/1971 / MRN: [id number]  SUBJECTIVE:  Patty Howell is a 46 y.o. female presenting for   She has No Known Allergies.   She  has a past medical history of Asthma; Depression; and Hypertension.    She  reports that she has never smoked. She has never used smokeless tobacco. She reports that she does not drink alcohol or use drugs. She  has no sexual activity history on file. The patient  has no past surgical history on file.  Her family history includes Hypertension in her mother.  Review of Systems  Constitutional: Negative for chills and fever.  Cardiovascular: Negative for chest pain.  Skin: Negative for rash.  Neurological: Negative for dizziness.  Psychiatric/Behavioral: Negative for hallucinations.    The problem list and medications were reviewed and updated by myself where necessary and exist elsewhere in the encounter.   OBJECTIVE:  BP 108/80 (BP Location: Right Arm, Patient Position: Sitting, Cuff Size: Small)   Pulse 93   Temp 97.8 F (36.6 C) (Oral)   Resp 16   Ht 4' 10"  (1.473 m)   Wt 138 lb 9.6 oz (62.9 kg)   LMP 08/11/2016   SpO2 99%   BMI 28.97 kg/m   Physical Exam  Constitutional: She is oriented to person, place, and time. She appears well-developed and well-nourished.  Cardiovascular: Regular rhythm and normal heart sounds.   No murmur heard. Pulmonary/Chest: Effort normal and breath sounds normal. No respiratory distress. She has no wheezes. She has no rales. She exhibits no tenderness.  Abdominal: Soft. Bowel sounds are normal. She exhibits no distension and no mass. There is no tenderness. There is no rebound and no guarding.  Musculoskeletal: Normal range of motion.  Neurological: She is alert and oriented to person, place, and time. No cranial nerve deficit.    Results for orders placed or performed during the hospital encounter of 08/20/16 (from the past 72 hour(s))  Urinalysis, Routine w reflex microscopic     Status:  None   Collection Time: 08/20/16  8:53 PM  Result Value Ref Range   Color, Urine YELLOW YELLOW   APPearance CLEAR CLEAR   Specific Gravity, Urine 1.025 1.005 - 1.030   pH 6.0 5.0 - 8.0   Glucose, UA NEGATIVE NEGATIVE mg/dL   Hgb urine dipstick NEGATIVE NEGATIVE   Bilirubin Urine NEGATIVE NEGATIVE   Ketones, ur NEGATIVE NEGATIVE mg/dL   Protein, ur NEGATIVE NEGATIVE mg/dL   Nitrite NEGATIVE NEGATIVE   Leukocytes, UA NEGATIVE NEGATIVE    Comment: Microscopic not done on urines with negative protein, blood, leukocytes, nitrite, or glucose < 500 mg/dL.  Lipase, blood     Status: None   Collection Time: 08/20/16  8:59 PM  Result Value Ref Range   Lipase 37 11 - 51 U/L  Comprehensive metabolic panel     Status: Abnormal   Collection Time: 08/20/16  8:59 PM  Result Value Ref Range   Sodium 136 135 - 145 mmol/L   Potassium 3.5 3.5 - 5.1 mmol/L   Chloride 101 101 - 111 mmol/L   CO2 26 22 - 32 mmol/L   Glucose, Bld 105 (H) 65 - 99 mg/dL   BUN 12 6 - 20 mg/dL   Creatinine, Ser 0.61 0.44 - 1.00 mg/dL   Calcium 9.7 8.9 - 10.3 mg/dL   Total Protein 8.6 (H) 6.5 - 8.1 g/dL   Albumin 4.3 3.5 - 5.0 g/dL   AST 23 15 - 41 U/L  ALT 23 14 - 54 U/L   Alkaline Phosphatase 75 38 - 126 U/L   Total Bilirubin 0.4 0.3 - 1.2 mg/dL   GFR calc non Af Amer >60 >60 mL/min   GFR calc Af Amer >60 >60 mL/min    Comment: (NOTE) The eGFR has been calculated using the CKD EPI equation. This calculation has not been validated in all clinical situations. eGFR's persistently <60 mL/min signify possible Chronic Kidney Disease.    Anion gap 9 5 - 15  CBC     Status: None   Collection Time: 08/20/16  8:59 PM  Result Value Ref Range   WBC 9.6 4.0 - 10.5 K/uL   RBC 4.59 3.87 - 5.11 MIL/uL   Hemoglobin 13.5 12.0 - 15.0 g/dL   HCT 40.6 36.0 - 46.0 %   MCV 88.5 78.0 - 100.0 fL   MCH 29.4 26.0 - 34.0 pg   MCHC 33.3 30.0 - 36.0 g/dL   RDW 14.2 11.5 - 15.5 %   Platelets 225 150 - 400 K/uL  I-Stat beta hCG blood,  ED     Status: None   Collection Time: 08/20/16  9:13 PM  Result Value Ref Range   I-stat hCG, quantitative <5.0 <5 mIU/mL   Comment 3            Comment:   GEST. AGE      CONC.  (mIU/mL)   <=1 WEEK        5 - 50     2 WEEKS       50 - 500     3 WEEKS       100 - 10,000     4 WEEKS     1,000 - 30,000        FEMALE AND NON-PREGNANT FEMALE:     LESS THAN 5 mIU/mL     Pulse Readings from Last 3 Encounters:  08/22/16 93  08/21/16 94  08/11/16 69   No results found.  Lab Results  Component Value Date   CREATININE 0.61 08/20/2016   BUN 12 08/20/2016   NA 136 08/20/2016   K 3.5 08/20/2016   CL 101 08/20/2016   CO2 26 08/20/2016   Lab Results  Component Value Date   TSH 2.676 11/20/2012   Orthostatic VS for the past 24 hrs:  BP- Lying Pulse- Lying BP- Sitting Pulse- Sitting BP- Standing at 0 minutes Pulse- Standing at 0 minutes  08/22/16 1746 118/83 110 111/72 124 92/55 108       ASSESSMENT AND PLAN:  Patty Howell was seen today for diarrhea.  Diagnoses and all orders for this visit:  Dizziness: EKG showing sinus tach.  BP dropping on orthostatics.  I liter NS here. She will collect stool sample to rule in/out infectios cause of diarrhea.  I'm checking a TSH as well. Stepping nausea therapy up to phenergan po.  Will see her back in 2-3 days if not improving.  Advised copious fluids.  -     EKG 12-Lead -     Orthostatic vital signs -     TSH -     POCT urinalysis dipstick  Orthostatic hypotension -     Insert peripheral IV  Diarrhea of presumed infectious origin -     GI Profile, Stool, PCR  Nausea and vomiting, intractability of vomiting not specified, unspecified vomiting type -     promethazine (PHENERGAN) injection 25 mg; Inject 1 mL (25 mg total) into the muscle now. -  promethazine (PHENERGAN) 25 MG tablet; Take 0.5-1 tablets (12.5-25 mg total) by mouth every 8 (eight) hours as needed for nausea or vomiting.    The patient is advised to call or return to  clinic if she does not see an improvement in symptoms, or to seek the care of the closest emergency department if she worsens with the above plan.   Philis Fendt, MHS, PA-C [facility] 08/22/2016 6:39 PM

## 2016-08-22 NOTE — Patient Instructions (Addendum)
You can make the diarrhea stop with an over the counter medication called immodium.  Please collect the sample before getting this to ensure we run that test.     IF you received an x-ray today, you will receive an invoice from [facility]. Please contact [facility] at [phone] with questions or concerns regarding your invoice.   IF you received labwork today, you will receive an invoice from [facility]. Please contact [facility] at [phone] with questions or concerns regarding your invoice.   Our billing staff will not be able to assist you with questions regarding bills from these companies.  You will be contacted with the lab results as soon as they are available. The fastest way to get your results is to activate your My Chart account. Instructions are located on the last page of this paperwork. If you have not heard from us regarding the results in 2 weeks, please contact this office.

## 2016-08-23 LAB — TSH: TSH: 1.17 u[IU]/mL (ref 0.450–4.500)

## 2016-08-24 ENCOUNTER — Ambulatory Visit: Payer: Commercial Managed Care - PPO | Admitting: Family Medicine

## 2016-08-26 LAB — GI PROFILE, STOOL, PCR
ADENOVIRUS F 40/41: NOT DETECTED
Astrovirus: NOT DETECTED
C difficile toxin A/B: NOT DETECTED
CRYPTOSPORIDIUM: NOT DETECTED
CYCLOSPORA CAYETANENSIS: NOT DETECTED
Campylobacter: NOT DETECTED
ENTAMOEBA HISTOLYTICA: NOT DETECTED
ENTEROAGGREGATIVE E COLI: NOT DETECTED
Enteropathogenic E coli: NOT DETECTED
Enterotoxigenic E coli: NOT DETECTED
GIARDIA LAMBLIA: NOT DETECTED
Norovirus GI/GII: NOT DETECTED
PLESIOMONAS SHIGELLOIDES: NOT DETECTED
Rotavirus A: NOT DETECTED
SALMONELLA: NOT DETECTED
SHIGELLA/ENTEROINVASIVE E COLI: NOT DETECTED
Sapovirus: NOT DETECTED
Shiga-toxin-producing E coli: NOT DETECTED
VIBRIO: NOT DETECTED
Vibrio cholerae: NOT DETECTED
YERSINIA ENTEROCOLITICA: NOT DETECTED

## 2017-02-23 ENCOUNTER — Other Ambulatory Visit: Payer: Self-pay | Admitting: Physician Assistant

## 2017-02-23 DIAGNOSIS — R112 Nausea with vomiting, unspecified: Secondary | ICD-10-CM

## 2017-02-27 ENCOUNTER — Other Ambulatory Visit: Payer: Self-pay | Admitting: *Deleted

## 2017-03-01 ENCOUNTER — Other Ambulatory Visit: Payer: Self-pay | Admitting: Physician Assistant

## 2017-03-01 DIAGNOSIS — R112 Nausea with vomiting, unspecified: Secondary | ICD-10-CM

## 2017-09-13 ENCOUNTER — Encounter: Payer: Self-pay | Admitting: Physician Assistant

## 2017-09-13 ENCOUNTER — Ambulatory Visit (INDEPENDENT_AMBULATORY_CARE_PROVIDER_SITE_OTHER): Payer: Commercial Managed Care - PPO | Admitting: Physician Assistant

## 2017-09-13 ENCOUNTER — Ambulatory Visit (INDEPENDENT_AMBULATORY_CARE_PROVIDER_SITE_OTHER): Payer: Commercial Managed Care - PPO

## 2017-09-13 ENCOUNTER — Other Ambulatory Visit: Payer: Self-pay

## 2017-09-13 VITALS — BP 142/88 | HR 79 | Temp 97.7°F | Resp 16 | Ht <= 58 in | Wt 140.0 lb

## 2017-09-13 DIAGNOSIS — R112 Nausea with vomiting, unspecified: Secondary | ICD-10-CM

## 2017-09-13 DIAGNOSIS — R42 Dizziness and giddiness: Secondary | ICD-10-CM

## 2017-09-13 DIAGNOSIS — R55 Syncope and collapse: Secondary | ICD-10-CM

## 2017-09-13 MED ORDER — PROMETHAZINE HCL 25 MG PO TABS: 12.5000 mg | ORAL_TABLET | Freq: Three times a day (TID) | ORAL | 0 refills | Status: DC | PRN

## 2017-09-13 NOTE — Addendum Note (Signed)
Addended by: Baldwin CrownJOHNSON, Yony Roulston D on: 09/13/2017 05:45 PM   Modules accepted: Orders

## 2017-09-13 NOTE — Progress Notes (Signed)
    09/13/2017 4:21 PM   DOB: 09/29/1970 / MRN: [id number]  SUBJECTIVE:  Patty Howell is a 47 y.o. female presenting for dizziness.  This is been a long-standing problem.  It comes and goes.  Today she tells me that the dizziness feels like " things are getting dark."  She does have some nausea and sometimes some cold sweats with this.  She denies of room spinning with this.  She is having mild dizziness at this time.  She has never lost consciousness.  She tells me she is more often dizzy at work and her job is a physical job.  She has No Known Allergies.   She  has a past medical history of Asthma, Depression, and Hypertension.    She  reports that  has never smoked. she has never used smokeless tobacco. She reports that she does not drink alcohol or use drugs. She  has no sexual activity history on file. The patient  has no past surgical history on file.  Her family history includes Hypertension in her mother.  Review of Systems  Respiratory: Negative for cough and shortness of breath.   Cardiovascular: Negative for chest pain, palpitations, orthopnea, claudication, leg swelling and PND.    The problem list and medications were reviewed and updated by myself where necessary and exist elsewhere in the encounter.   OBJECTIVE:  BP (!) 142/88 (BP Location: Right Arm, Patient Position: Sitting)   Pulse 79   Temp 97.7 F (36.5 C) (Oral)   Resp 16   Ht 4\' 10"  (1.473 m)   Wt 140 lb (63.5 kg)   SpO2 100%   BMI 29.26 kg/m   Physical Exam  No results found for this or any previous visit (from the past 72 hour(s)).  Dg Chest 2 View  Result Date: 09/13/2017 CLINICAL DATA:  Dizziness. EXAM: CHEST  2 VIEW COMPARISON:  None. FINDINGS: Lungs clear. Heart size normal. No pneumothorax or pleural effusion. No bony abnormality. IMPRESSION: Negative chest. Electronically Signed   By: Drusilla Kannerhomas  Dalessio M.D.   On: 09/13/2017 16:10    ASSESSMENT AND PLAN:  [name] was seen today for  dizziness.  Diagnoses and all orders for this visit:  Postural dizziness with presyncope: Her EKG appears to be normal today.  Chest x-ray also normal.  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  Given the chronicity and the diaphoresis with physical activity think it is best that she be evaluated by cards.  I placed [facility] to [facility]. -     Lipid panel -     Hemoglobin A1c -     TSH -     EKG 12-Lead -     DG Chest 2 View -     Urinalysis, dipstick only  Nausea and vomiting, intractability of vomiting not specified, unspecified vomiting type -     promethazine (PHENERGAN) 25 MG tablet; Take 0.5-1 tablets (12.5-25 mg total) by mouth every 8 (eight) hours as needed for nausea or vomiting.    The patient is advised to call or return to clinic if she does not see an improvement in symptoms, or to seek the care of the closest emergency department if she worsens with the above plan.   [name] [name], MHS, PA-C [facility] 09/13/2017 4:21 PM

## 2017-09-13 NOTE — Patient Instructions (Signed)
     IF you received an x-ray today, you will receive an invoice from [facility]. Please contact [facility] at [phone] with questions or concerns regarding your invoice.   IF you received labwork today, you will receive an invoice from [facility]. Please contact [facility] at [phone] with questions or concerns regarding your invoice.   Our billing staff will not be able to assist you with questions regarding bills from these companies.  You will be contacted with the lab results as soon as they are available. The fastest way to get your results is to activate your My Chart account. Instructions are located on the last page of this paperwork. If you have not heard from us regarding the results in 2 weeks, please contact this office.     

## 2017-09-14 LAB — LIPID PANEL
CHOLESTEROL TOTAL: 161 mg/dL (ref 100–199)
Chol/HDL Ratio: 3.8 ratio (ref 0.0–4.4)
HDL: 42 mg/dL (ref >39)
LDL Calculated: 94 mg/dL (ref 0–99)
Triglycerides: 123 mg/dL (ref 0–149)
VLDL CHOLESTEROL CAL: 25 mg/dL (ref 5–40)

## 2017-09-14 LAB — HEMOGLOBIN A1C
ESTIMATED AVERAGE GLUCOSE: 123 mg/dL
Hgb A1c MFr Bld: 5.9 % — ABNORMAL HIGH (ref 4.8–5.6)

## 2017-09-14 LAB — TSH: TSH: 2.75 u[IU]/mL (ref 0.450–4.500)

## 2017-09-14 LAB — URINALYSIS, DIPSTICK ONLY

## 2017-09-29 ENCOUNTER — Telehealth: Payer: Self-pay | Admitting: Physician Assistant

## 2017-09-29 NOTE — Telephone Encounter (Signed)
Copied from CRM [id number]. Topic: Referral - Question >> Sep 28, 2017 11:49 AM Guinevere FerrariMorris, Sharamare E, NT wrote: CRM for notification. See Telephone encounter for: Patty SpragueBeverly called and said that they were trying to reach the patient to schedule an appointment but pt doesn't speak English. She wanted to see if the referral coordinator would contact the patient to get patient scheduled. She would like a call back. [phone]  09/28/17. -----------------------------------------------------------------------------------------  Patty Somanalled Patty Howell and she gave me an appt time that I can schedule the pt for. I called the translator service to see if that appt would work for pt. She said it would for now and if she needed to change, she would call them back. The appt is [facility] at [facility] for 10/06/17 at 11:30 am. I let Patty SpragueBeverly know pt can go for now and will call back if needed.

## 2019-05-29 ENCOUNTER — Encounter (HOSPITAL_COMMUNITY): Payer: Self-pay

## 2019-05-29 ENCOUNTER — Other Ambulatory Visit: Payer: Self-pay

## 2019-05-29 ENCOUNTER — Ambulatory Visit (HOSPITAL_COMMUNITY)
Admission: EM | Admit: 2019-05-29 | Discharge: 2019-05-29 | Disposition: A | Discharge status: Home / Self Care | Payer: Commercial Managed Care - PPO | Attending: Family Medicine | Admitting: Family Medicine

## 2019-05-29 DIAGNOSIS — Z20828 Contact with and (suspected) exposure to other viral communicable diseases: Secondary | ICD-10-CM | POA: Diagnosis not present

## 2019-05-29 DIAGNOSIS — J45909 Unspecified asthma, uncomplicated: Secondary | ICD-10-CM | POA: Diagnosis not present

## 2019-05-29 DIAGNOSIS — Z8249 Family history of ischemic heart disease and other diseases of the circulatory system: Secondary | ICD-10-CM | POA: Diagnosis not present

## 2019-05-29 DIAGNOSIS — Z9119 Patient's noncompliance with other medical treatment and regimen: Secondary | ICD-10-CM | POA: Insufficient documentation

## 2019-05-29 DIAGNOSIS — I10 Essential (primary) hypertension: Secondary | ICD-10-CM

## 2019-05-29 DIAGNOSIS — Z823 Family history of stroke: Secondary | ICD-10-CM | POA: Diagnosis not present

## 2019-05-29 DIAGNOSIS — Z20822 Contact with and (suspected) exposure to covid-19: Secondary | ICD-10-CM

## 2019-05-29 DIAGNOSIS — B349 Viral infection, unspecified: Secondary | ICD-10-CM

## 2019-05-29 DIAGNOSIS — R0981 Nasal congestion: Secondary | ICD-10-CM | POA: Diagnosis present

## 2019-05-29 DIAGNOSIS — I160 Hypertensive urgency: Secondary | ICD-10-CM | POA: Diagnosis not present

## 2019-05-29 DIAGNOSIS — R030 Elevated blood-pressure reading, without diagnosis of hypertension: Secondary | ICD-10-CM

## 2019-05-29 MED ORDER — AMLODIPINE BESYLATE 5 MG PO TABS: 5.0000 mg | ORAL_TABLET | Freq: Every day | ORAL | 0 refills | Status: DC

## 2019-05-29 MED ORDER — CETIRIZINE HCL 10 MG PO TABS: 10.0000 mg | ORAL_TABLET | Freq: Every day | ORAL | 0 refills | Status: DC

## 2019-05-29 NOTE — ED Notes (Signed)
Pt agreed and understood it will take 3-5 days for Covid test result.

## 2019-05-29 NOTE — ED Triage Notes (Addendum)
Pt reports nasal congestion x 8 days, no other symptoms. Pt wants a Covid test for international travel to [place].

## 2019-05-29 NOTE — Discharge Instructions (Signed)
Para el dolor de garganta o tos intente usar un t a base de miel. Use 3 cucharaditas de miel con jugo exprimido de [employer]. Coloque trozos de Pension scheme manager en 1 / 2-1 taza de agua y caliente sobre la estufa. Luego mezcle los ingredientes y repita cada 4 horas segn sea necesario. Tome Tylenol 500mg -650mg  cada 6 horas. Hidrata muy bien con al menos 2 litros de [place]. Coma comidas ligeras como sopas para [employer] electrolitos y coma frutas suaves, verduras. Comience un antihistamnico como Zyrtec, Allegra o Claritin.

## 2019-05-29 NOTE — ED Provider Notes (Signed)
MRN: [id number] DOB: [birth year]  Subjective:   Patty Howell is a 48 y.o. female presenting for 1 week history of mild sinus congestion.  Patient states that she also has a history of hypertension, is not consistent or compliant with her medications.  Diet is also noncompliant.  States that she is here to get Covid testing so that she can travel to see her mom in [place].  Reports that her mom has a history of hypertension and just had a stroke secondary to her own hypertension.  She is not currently taking any medications.  No Known Allergies  Past Medical History:  Diagnosis Date  . Asthma   . Depression   . Hypertension      Denies past surgical history.  Denies drinking alcohol, smoking cigarettes.  ROS  Objective:   Vitals: BP (!) 195/92 (BP Location: Left Arm)   Pulse 75   Temp 98.6 F (37 C) (Temporal)   SpO2 100%   Physical Exam Constitutional:      General: She is not in acute distress.    Appearance: Normal appearance. She is well-developed and normal weight. She is not ill-appearing, toxic-appearing or diaphoretic.  HENT:     Head: Normocephalic and atraumatic.     Right Ear: Tympanic membrane, ear canal and external ear normal. No drainage or tenderness. No middle ear effusion. Tympanic membrane is not erythematous.     Left Ear: Tympanic membrane, ear canal and external ear normal. No drainage or tenderness.  No middle ear effusion. Tympanic membrane is not erythematous.     Nose: Congestion and rhinorrhea present.     Mouth/Throat:     Mouth: Mucous membranes are moist. No oral lesions.     Pharynx: Oropharynx is clear. No pharyngeal swelling, oropharyngeal exudate, posterior oropharyngeal erythema or uvula swelling.     Tonsils: No tonsillar exudate or tonsillar abscesses.  Eyes:     General: No scleral icterus.    Extraocular Movements: Extraocular movements intact.     Right eye: Normal extraocular motion.     Left eye: Normal extraocular motion.   Conjunctiva/sclera: Conjunctivae normal.     Pupils: Pupils are equal, round, and reactive to light.  Neck:     Musculoskeletal: Normal range of motion and neck supple.  Cardiovascular:     Rate and Rhythm: Normal rate and regular rhythm.     Pulses: Normal pulses.     Heart sounds: Normal heart sounds. No murmur. No friction rub. No gallop.   Pulmonary:     Effort: Pulmonary effort is normal. No respiratory distress.     Breath sounds: Normal breath sounds. No stridor. No wheezing, rhonchi or rales.  Abdominal:     General: Bowel sounds are normal. There is no distension.     Palpations: Abdomen is soft. There is no mass.     Tenderness: There is no abdominal tenderness. There is no right CVA tenderness, left CVA tenderness, guarding or rebound.  Musculoskeletal:        General: No swelling.     Right lower leg: No edema.     Left lower leg: No edema.  Lymphadenopathy:     Cervical: No cervical adenopathy.  Skin:    General: Skin is warm and dry.     Coloration: Skin is not pale.     Findings: No rash.  Neurological:     General: No focal deficit present.     Mental Status: She is alert and oriented to person, place, and  time.     Cranial Nerves: No cranial nerve deficit.     Motor: No weakness.     Coordination: Coordination normal.     Deep Tendon Reflexes: Reflexes normal.  Psychiatric:        Mood and Affect: Mood normal.        Behavior: Behavior normal.        Thought Content: Thought content normal.        Judgment: Judgment normal.      Assessment and Plan :   1. Viral syndrome   2. Essential hypertension   3. Elevated blood pressure reading   4. Hypertensive urgency   5. Nasal congestion     Will manage for viral illness. Counseled patient on nature of COVID-19 including modes of transmission, diagnostic testing, management and supportive care.  Offered symptomatic relief. COVID 19 testing is pending.  Reviewed management of hypertension, hypertensive  urgency extensively with patient we will have her start amlodipine at 5 mg once daily.  Emphasized need for significant dietary modifications.  Counseled patient on potential for adverse effects with medications prescribed/recommended today, ER and return-to-clinic precautions discussed, patient verbalized understanding.     Wallis Bamberg, PA-C 05/29/19 1056

## 2019-05-31 LAB — NOVEL CORONAVIRUS, NAA: SARS-CoV-2, NAA: NOT DETECTED

## 2019-06-02 LAB — NOVEL CORONAVIRUS, NAA (HOSP ORDER, SEND-OUT TO REF LAB; TAT 18-24 HRS): SARS-CoV-2, NAA: NOT DETECTED

## 2019-08-15 ENCOUNTER — Ambulatory Visit (HOSPITAL_COMMUNITY)
Admission: EM | Admit: 2019-08-15 | Discharge: 2019-08-15 | Disposition: A | Discharge status: Home / Self Care | Payer: Commercial Managed Care - PPO | Attending: Internal Medicine | Admitting: Internal Medicine

## 2019-08-15 ENCOUNTER — Encounter (HOSPITAL_COMMUNITY): Payer: Self-pay

## 2019-08-15 ENCOUNTER — Other Ambulatory Visit: Payer: Self-pay

## 2019-08-15 DIAGNOSIS — K2970 Gastritis, unspecified, without bleeding: Secondary | ICD-10-CM

## 2019-08-15 DIAGNOSIS — K2990 Gastroduodenitis, unspecified, without bleeding: Secondary | ICD-10-CM | POA: Diagnosis present

## 2019-08-15 DIAGNOSIS — I10 Essential (primary) hypertension: Secondary | ICD-10-CM | POA: Diagnosis present

## 2019-08-15 DIAGNOSIS — R42 Dizziness and giddiness: Secondary | ICD-10-CM

## 2019-08-15 LAB — CBC
HCT: 44.3 % (ref 36.0–46.0)
Hemoglobin: 14.6 g/dL (ref 12.0–15.0)
MCH: 29.1 pg (ref 26.0–34.0)
MCHC: 33 g/dL (ref 30.0–36.0)
MCV: 88.4 fL (ref 80.0–100.0)
Platelets: 243 10*3/uL (ref 150–400)
RBC: 5.01 MIL/uL (ref 3.87–5.11)
RDW: 14.5 % (ref 11.5–15.5)
WBC: 8.4 10*3/uL (ref 4.0–10.5)
nRBC: 0 % (ref 0.0–0.2)

## 2019-08-15 LAB — BASIC METABOLIC PANEL
Anion gap: 10 (ref 5–15)
BUN: 13 mg/dL (ref 6–20)
CO2: 24 mmol/L (ref 22–32)
Calcium: 9.7 mg/dL (ref 8.9–10.3)
Chloride: 103 mmol/L (ref 98–111)
Creatinine, Ser: 0.56 mg/dL (ref 0.44–1.00)
GFR calc Af Amer: >60 mL/min (ref >60)
GFR calc non Af Amer: >60 mL/min (ref >60)
Glucose, Bld: 116 mg/dL — ABNORMAL HIGH (ref 70–99)
Potassium: 4.1 mmol/L (ref 3.5–5.1)
Sodium: 137 mmol/L (ref 135–145)

## 2019-08-15 LAB — TSH: TSH: 2.837 u[IU]/mL (ref 0.350–4.500)

## 2019-08-15 MED ORDER — PANTOPRAZOLE SODIUM 20 MG PO TBEC: 20.0000 mg | DELAYED_RELEASE_TABLET | Freq: Every day | ORAL | 1 refills | Status: AC

## 2019-08-15 MED ORDER — ONDANSETRON 4 MG PO TBDP: 4.0000 mg | ORAL_TABLET | Freq: Three times a day (TID) | ORAL | 0 refills | Status: AC | PRN

## 2019-08-15 MED ORDER — AMLODIPINE BESYLATE 10 MG PO TABS: 10.0000 mg | ORAL_TABLET | Freq: Every day | ORAL | 1 refills | Status: DC

## 2019-08-15 NOTE — ED Triage Notes (Signed)
Pt presents to the UC with headache, dizziness and nausea x 15 days. Pt reports when she feels dizzy the room starts spinning.

## 2019-08-15 NOTE — ED Provider Notes (Signed)
[facility]    CSN: [id number] Arrival date & time: 08/15/19  1655      History   Chief Complaint Chief Complaint  Patient presents with  . Dizziness  . Nausea  . Headache    HPI Patty Howell is a 48 y.o. female with history of asthma, hypertension comes to urgent care with complaints of dizziness, nausea and a spinning sensation over the past couple of weeks.  Patient says symptoms started insidiously and has been progressive and persistent.  She also complains of some abdominal discomfort mainly in the epigastric area.  It is aggravated by oral intake.  No known relieving factors.  No weight loss.  Is associated with nausea but no vomiting.  No change in bowel movements.  Patient was prescribed omeprazole but has been taking it inconsistently.  Patient complains of a headache with dizziness.  She has been prescribed antihypertensive agents but not taking it regularly.  She is currently on amlodipine 5 mg and feels that is not helping her.   HPI  Past Medical History:  Diagnosis Date  . Asthma   . Depression   . Hypertension     Patient Active Problem List   Diagnosis Date Noted  . Depressed affect 02/04/2013  . HA (headache) 02/04/2013  . Hypertension 10/23/2011    History reviewed. No pertinent surgical history.  OB History   No obstetric history on file.      Home Medications    Prior to Admission medications   Medication Sig Start Date End Date Taking? Authorizing Provider  amLODipine (NORVASC) 10 MG tablet Take 1 tablet (10 mg total) by mouth daily. 08/15/19   Merrilee JanskyLamptey, Pranit Owensby O, MD  ondansetron (ZOFRAN ODT) 4 MG disintegrating tablet Take 1 tablet (4 mg total) by mouth every 8 (eight) hours as needed for nausea or vomiting. 08/15/19   Shady Padron, Britta MccreedyPhilip O, MD  pantoprazole (PROTONIX) 20 MG tablet Take 1 tablet (20 mg total) by mouth daily. 08/15/19   Merrilee JanskyLamptey, Jakobi Thetford O, MD  cetirizine (ZYRTEC ALLERGY) 10 MG tablet Take 1 tablet (10 mg total) by mouth  daily. 05/29/19 08/15/19  Wallis BambergMani, Mario, PA-C  promethazine (PHENERGAN) 25 MG tablet Take 0.5-1 tablets (12.5-25 mg total) by mouth every 8 (eight) hours as needed for nausea or vomiting. 09/13/17 05/29/19  Ofilia Neaslark, Michael L, PA-C    Family History Family History  Problem Relation Age of Onset  . Hypertension Mother     Social History Social History   Tobacco Use  . Smoking status: Never Smoker  . Smokeless tobacco: Never Used  Substance Use Topics  . Alcohol use: No  . Drug use: No     Allergies   Patient has no known allergies.   Review of Systems Review of Systems  Constitutional: Negative for activity change, chills, fatigue and fever.  HENT: Negative.   Respiratory: Negative for cough, chest tightness and shortness of breath.   Gastrointestinal: Positive for nausea. Negative for abdominal pain, diarrhea and vomiting.  Genitourinary: Negative for dysuria, frequency and urgency.  Musculoskeletal: Negative for arthralgias and myalgias.  Skin: Negative for pallor and rash.  Neurological: Positive for dizziness and headaches. Negative for weakness and light-headedness.  Psychiatric/Behavioral: Negative for confusion and decreased concentration.     Physical Exam Triage Vital Signs ED Triage Vitals  Enc Vitals Group     BP 08/15/19 1739 (!) 165/89     Pulse Rate 08/15/19 1739 84     Resp 08/15/19 1739 16  Temp 08/15/19 1739 98.3 F (36.8 C)     Temp Source 08/15/19 1739 Oral     SpO2 08/15/19 1739 100 %     Weight --      Height --      Head Circumference --      Peak Flow --      Pain Score 08/15/19 1737 5     Pain Loc --      Pain Edu? --      Excl. in GC? --    No data found.  Updated Vital Signs BP (!) 165/89 (BP Location: Right Arm)   Pulse 84   Temp 98.3 F (36.8 C) (Oral)   Resp 16   LMP  (Within Weeks) Comment: 2 weeks  SpO2 100%   Visual Acuity Right Eye Distance:   Left Eye Distance:   Bilateral Distance:    Right Eye Near:   Left  Eye Near:    Bilateral Near:     Physical Exam Vitals and nursing note reviewed.  Eyes:     Extraocular Movements: Extraocular movements intact.  Cardiovascular:     Rate and Rhythm: Normal rate and regular rhythm.  Pulmonary:     Effort: Pulmonary effort is normal. No respiratory distress.     Breath sounds: No rhonchi or rales.  Abdominal:     General: Bowel sounds are normal. There is no distension.     Palpations: Abdomen is soft.     Tenderness: There is no guarding.  Musculoskeletal:        General: Normal range of motion.     Cervical back: Normal range of motion. No rigidity.  Lymphadenopathy:     Cervical: No cervical adenopathy.  Skin:    General: Skin is warm.     Capillary Refill: Capillary refill takes less than 2 seconds.  Neurological:     Mental Status: She is alert and oriented to person, place, and time.     GCS: GCS eye subscore is 4. GCS verbal subscore is 5. GCS motor subscore is 6.     Cranial Nerves: No cranial nerve deficit.     Sensory: No sensory deficit.     Motor: No weakness.     Deep Tendon Reflexes: Reflexes normal.  Psychiatric:        Mood and Affect: Mood normal.      UC Treatments / Results  Labs (all labs ordered are listed, but only abnormal results are displayed) Labs Reviewed  BASIC METABOLIC PANEL - Abnormal; Notable for the following components:      Result Value   Glucose, Bld 116 (*)    All other components within normal limits  CBC  TSH    EKG   Radiology No results found.  Procedures Procedures (including critical care time)  Medications Ordered in UC Medications - No data to display  Initial Impression / Assessment and Plan / UC Course  I have reviewed the triage vital signs and the nursing notes.  Pertinent labs & imaging results that were available during my care of the patient were reviewed by me and considered in my medical decision making (see chart for details).     1.  Uncontrolled hypertension  with headaches: Increase amlodipine to 10 mg orally daily Patient encouraged to be compliant with her medications If patient's symptoms worsen she is advised to return to urgent care to be reevaluated.  2.  Abdominal pain: Protonix 20 mg orally daily.  If no improvement after 30  days patient may benefit from a GI evaluation. Patient has no warning signs to suggest an urgent EGD or GI evaluation. Final Clinical Impressions(s) / UC Diagnoses   Final diagnoses:  Dizziness  Gastritis and gastroduodenitis  Essential hypertension   Discharge Instructions   None    ED Prescriptions    Medication Sig Dispense Auth. Provider   amLODipine (NORVASC) 10 MG tablet Take 1 tablet (10 mg total) by mouth daily. 30 tablet Deone Omahoney, Myrene Galas, MD   ondansetron (ZOFRAN ODT) 4 MG disintegrating tablet Take 1 tablet (4 mg total) by mouth every 8 (eight) hours as needed for nausea or vomiting. 20 tablet Yukiko Minnich, Myrene Galas, MD   pantoprazole (PROTONIX) 20 MG tablet Take 1 tablet (20 mg total) by mouth daily. 30 tablet Seylah Wernert, Myrene Galas, MD     PDMP not reviewed this encounter.   Chase Picket, MD 08/19/19 [id number]

## 2019-08-21 ENCOUNTER — Encounter: Payer: Self-pay | Admitting: Emergency Medicine

## 2019-08-21 ENCOUNTER — Other Ambulatory Visit: Payer: Self-pay

## 2019-08-21 ENCOUNTER — Ambulatory Visit (INDEPENDENT_AMBULATORY_CARE_PROVIDER_SITE_OTHER): Payer: 59 | Admitting: Emergency Medicine

## 2019-08-21 VITALS — BP 131/84 | HR 84 | Temp 98.5°F | Resp 16 | Ht <= 58 in | Wt 138.0 lb

## 2019-08-21 DIAGNOSIS — B349 Viral infection, unspecified: Secondary | ICD-10-CM | POA: Diagnosis not present

## 2019-08-21 DIAGNOSIS — Z23 Encounter for immunization: Secondary | ICD-10-CM | POA: Diagnosis not present

## 2019-08-21 DIAGNOSIS — Z7689 Persons encountering health services in other specified circumstances: Secondary | ICD-10-CM

## 2019-08-21 DIAGNOSIS — I10 Essential (primary) hypertension: Secondary | ICD-10-CM

## 2019-08-21 LAB — POCT URINALYSIS DIP (MANUAL ENTRY)
Bilirubin, UA: NEGATIVE
Blood, UA: NEGATIVE
Glucose, UA: NEGATIVE mg/dL
Ketones, POC UA: NEGATIVE mg/dL
Leukocytes, UA: NEGATIVE
Nitrite, UA: NEGATIVE
Protein Ur, POC: 100 mg/dL — AB
Spec Grav, UA: 1.02 (ref 1.010–1.025)
Urobilinogen, UA: 2 E.U./dL — AB
pH, UA: 7 (ref 5.0–8.0)

## 2019-08-21 NOTE — Progress Notes (Signed)
Patty FarrierElva Howell 49 y.o.   Chief Complaint  Patient presents with  . Headache    for years with nausea and vomiting  . Establish Care    HISTORY OF PRESENT ILLNESS: This is a 49 y.o. female here to establish care with me.  Used to see PA United AutoMani. Not feeling well for the past 3 to 4 weeks with occasional headaches, dizziness, nausea and general malaise.  Denies fever or chills.  Patient and her family were Covid positive last summer.  Records well. No additional symptoms. Has a history of hypertension on amlodipine 10 mg daily.  Recently seen at urgent care clinic and started on Protonix and Zofran for symptom relief.  No recent blood work. No other complaints or medical concerns today.  HPI   Prior to Admission medications   Medication Sig Start Date End Date Taking? Authorizing Provider  amLODipine (NORVASC) 10 MG tablet Take 1 tablet (10 mg total) by mouth daily. 08/15/19  Yes Lamptey, Britta MccreedyPhilip O, MD  ondansetron (ZOFRAN ODT) 4 MG disintegrating tablet Take 1 tablet (4 mg total) by mouth every 8 (eight) hours as needed for nausea or vomiting. 08/15/19  Yes Lamptey, Britta MccreedyPhilip O, MD  pantoprazole (PROTONIX) 20 MG tablet Take 1 tablet (20 mg total) by mouth daily. 08/15/19  Yes Lamptey, Britta MccreedyPhilip O, MD  cetirizine (ZYRTEC ALLERGY) 10 MG tablet Take 1 tablet (10 mg total) by mouth daily. 05/29/19 08/15/19  Wallis BambergMani, Mario, PA-C  promethazine (PHENERGAN) 25 MG tablet Take 0.5-1 tablets (12.5-25 mg total) by mouth every 8 (eight) hours as needed for nausea or vomiting. 09/13/17 05/29/19  Ofilia Neaslark, Michael L, PA-C    No Known Allergies  Patient Active Problem List   Diagnosis Date Noted  . Depressed affect 02/04/2013  . HA (headache) 02/04/2013  . Hypertension 10/23/2011    Past Medical History:  Diagnosis Date  . Asthma   . Depression   . Hypertension     History reviewed. No pertinent surgical history.  Social History   Socioeconomic History  . Marital status: Single    Spouse name: Not on  file  . Number of children: Not on file  . Years of education: Not on file  . Highest education level: Not on file  Occupational History  . Not on file  Tobacco Use  . Smoking status: Never Smoker  . Smokeless tobacco: Never Used  Substance and Sexual Activity  . Alcohol use: No  . Drug use: No  . Sexual activity: Not on file  Other Topics Concern  . Not on file  Social History Narrative  . Not on file   Social Determinants of Health   Financial Resource Strain:   . Difficulty of Paying Living Expenses: Not on file  Food Insecurity:   . Worried About Programme researcher, broadcasting/film/videounning Out of Food in the Last Year: Not on file  . Ran Out of Food in the Last Year: Not on file  Transportation Needs:   . Lack of Transportation (Medical): Not on file  . Lack of Transportation (Non-Medical): Not on file  Physical Activity:   . Days of Exercise per Week: Not on file  . Minutes of Exercise per Session: Not on file  Stress:   . Feeling of Stress : Not on file  Social Connections:   . Frequency of Communication with Friends and Family: Not on file  . Frequency of Social Gatherings with Friends and Family: Not on file  . Attends Religious Services: Not on file  . Active Member  of Clubs or Organizations: Not on file  . Attends Archivist Meetings: Not on file  . Marital Status: Not on file  Intimate Partner Violence:   . Fear of Current or Ex-Partner: Not on file  . Emotionally Abused: Not on file  . Physically Abused: Not on file  . Sexually Abused: Not on file    Family History  Problem Relation Age of Onset  . Hypertension Mother      Review of Systems  Constitutional: Negative.  Negative for chills and fever.  HENT: Negative.  Negative for congestion and sore throat.   Respiratory: Negative.  Negative for cough and shortness of breath.   Cardiovascular: Negative.  Negative for chest pain and palpitations.  Gastrointestinal: Positive for nausea. Negative for abdominal pain, blood in  stool, diarrhea, melena and vomiting.  Genitourinary: Negative.  Negative for dysuria and hematuria.  Musculoskeletal: Negative.  Negative for back pain, myalgias and neck pain.  Skin: Negative.  Negative for rash.  Neurological: Positive for headaches. Negative for dizziness, sensory change and focal weakness.  Endo/Heme/Allergies: Negative.   All other systems reviewed and are negative.  Today's Vitals   08/21/19 1452  BP: 131/84  Pulse: 84  Resp: 16  Temp: 98.5 F (36.9 C)  TempSrc: Temporal  SpO2: 98%  Weight: 138 lb (62.6 kg)  Height: 4\' 10"  (1.473 m)   Body mass index is 28.84 kg/m.   Physical Exam Vitals reviewed.  Constitutional:      Appearance: She is well-developed.  HENT:     Head: Normocephalic.  Eyes:     Extraocular Movements: Extraocular movements intact.     Conjunctiva/sclera: Conjunctivae normal.     Pupils: Pupils are equal, round, and reactive to light.  Neck:     Vascular: No carotid bruit.  Cardiovascular:     Rate and Rhythm: Normal rate and regular rhythm.     Heart sounds: Normal heart sounds.  Pulmonary:     Effort: Pulmonary effort is normal.     Breath sounds: Normal breath sounds.  Abdominal:     General: Bowel sounds are normal. There is no distension.     Palpations: Abdomen is soft.     Tenderness: There is no abdominal tenderness.  Musculoskeletal:        General: Normal range of motion.     Cervical back: Normal range of motion and neck supple.  Lymphadenopathy:     Cervical: No cervical adenopathy.  Skin:    General: Skin is warm and dry.     Capillary Refill: Capillary refill takes less than 2 seconds.  Neurological:     General: No focal deficit present.     Mental Status: She is alert and oriented to person, place, and time.     Sensory: No sensory deficit.     Motor: No weakness.     Gait: Gait normal.  Psychiatric:        Mood and Affect: Mood normal.        Behavior: Behavior normal.    Results for orders  placed or performed in visit on 08/21/19 (from the past 24 hour(s))  POCT urinalysis dipstick     Status: Abnormal   Collection Time: 08/21/19  3:35 PM  Result Value Ref Range   Color, UA yellow yellow   Clarity, UA clear clear   Glucose, UA negative negative mg/dL   Bilirubin, UA negative negative   Ketones, POC UA negative negative mg/dL   Spec Grav, UA 1.020  1.010 - 1.025   Blood, UA negative negative   pH, UA 7.0 5.0 - 8.0   Protein Ur, POC =100 (A) negative mg/dL   Urobilinogen, UA 2.0 (A) 0.2 or 1.0 E.U./dL   Nitrite, UA Negative Negative   Leukocytes, UA Negative Negative     ASSESSMENT & PLAN: Kelicia was seen today for headache and establish care.  Diagnoses and all orders for this visit:  Viral illness -     CBC with Differential -     Comprehensive metabolic panel -     POCT urinalysis dipstick -     SAR CoV2 Serology (COVID 19)AB(IGG)IA  Essential hypertension  Encounter to establish care  Need for prophylactic vaccination and inoculation against influenza -     Flu Vaccine QUAD 36+ mos IM  A total of 25 minutes was spent in the room with the patient, greater than 50% of which was in counseling/coordination of care regarding differential diagnosis, diagnostic approach, treatment, need for diagnostic blood work, medication review, prognosis, and need for follow-up and 4 weeks.   Patient Instructions       If you have lab work done today you will be contacted with your lab results within the next 2 weeks.  If you have not heard from [place] then please contact us. The fastest way to get your results is to register for My Chart.   IF you received an x-ray today, you will receive an invoice from [facility]. Please contact [facility] at [phone] with questions or concerns regarding your invoice.   IF you received labwork today, you will receive an invoice from [facility]. Please contact [facility] at [phone] with questions or concerns  regarding your invoice.   Our billing staff will not be able to assist you with questions regarding bills from these companies.  You will be contacted with the lab results as soon as they are available. The fastest way to get your results is to activate your My Chart account. Instructions are located on the last page of this paperwork. If you have not heard from [place] regarding the results in 2 weeks, please contact this office.     Enfermedades virales en los adultos (Viral Illness, Adult) Los virus son microbios diminutos que entran en el organismo de Neomia Dear persona y Doctor, general practice. Hay muchos tipos de virus diferentes y causan muchas clases de enfermedades. Las enfermedades virales pueden ser leves o graves. Pueden afectar diferentes partes del cuerpo. Las enfermedades frecuentes causadas por virus incluyen los resfros y Emergency planning/management officer. Adems, las enfermedades virales abarcan cuadros clnicos graves, como el VIH/sida (virus de inmunodeficiencia humana/sndrome de inmunodeficiencia adquirida). Se han identificado unos pocos virus asociados con determinados tipos de cncer. CULES SON LAS CAUSAS? Muchos tipos de virus pueden causar enfermedades. Los virus invaden las clulas del organismo, se multiplican y Estate agent la disfuncin o la muerte de las clulas infectadas. Cuando la clula muere, libera ms virus. Cuando esto ocurre, aparecen sntomas de la enfermedad, y el virus sigue diseminndose a otras clulas. Si el virus asume la funcin de la clula, puede hacer que esta se divida y crezca fuera de control, y este es el caso en el que un virus causa cncer. Los diferentes virus ingresan al organismo de [employer]. Puede contraer un virus de la siguiente [place]:  Al ingerir alimentos o beber agua contaminados con el virus.  Al inhalar gotitas que una persona infectada liber en el aire al toser o estornudar.  Al tocar una superficie contaminada  con el virus y [facility] [facility] mano a la  boca, la nariz o los ojos.  Al ser picado por un insecto o mordido por un animal que son portadores del virus.  Al tener contacto sexual con Neomia Dear persona infectada por el virus.  Al tener contacto con sangre o lquidos que contienen el virus, ya sea a travs de un corte abierto o durante una transfusin. Si el virus ingresa al organismo, el sistema de defensa del cuerpo (sistema inmunitario) Product/process development scientist. Puede correr un riesgo ms alto de tener una enfermedad viral si tiene el sistema inmunitario debilitado. CULES SON LOS SIGNOS O LOS SNTOMAS? Los sntomas varan en funcin del tipo de virus y de la ubicacin de las clulas que este invade. Los sntomas frecuentes de los principales tipos de enfermedades virales incluyen los siguientes: Virus del resfro y de la gripe  [place].  Dolor de [place].  Dolor de Advertising copywriter.  Dolores musculares.  Congestin nasal.  Tos. Virus del aparato digestivo (gastrointestinales)  [place].  Dolor abdominal.  Nuseas.  Diarrea. Virus hepticos (hepatitis)  Prdida del apetito.  Cansancio.  Tono amarillento de la piel (ictericia). Virus del cerebro y la mdula espinal  [place].  Dolor de [place].  Rigidez en el cuello.  Nuseas y vmitos.  Confusin o somnolencia. Virus de la piel  [place]rugas.  Picazn.  Erupcin cutnea. Virus de transmisin sexual  Secrecin.  Hinchazn.  Enrojecimiento.  Erupcin cutnea. CMO SE TRATA ESTA AFECCIN? Los virus pueden ser difciles de tratar porque se hospedan en las clulas. Los antibiticos no tratan los virus porque no llegan al interior de las clulas. El tratamiento de una enfermedad viral puede incluir lo siguiente:  Lawyer y beber abundantes lquidos.  Medicamentos para [employer]. Estos pueden incluir medicamentos de venta libre para Chief Technology Officer y la [place], medicamentos para la tos o la congestin y medicamentos para Actuary.  Medicamentos  antivirales. Estos frmacos estn disponibles nicamente para determinados tipos de virus. Pueden ayudar a [employer] de la gripe, si se toman apenas comienza el cuadro. Tambin hay antivirales para la hepatitis y el VIH/sida. Algunas enfermedades virales pueden evitarse con vacunas. Un ejemplo frecuente es la vacuna antigripal. SIGA ESTAS INDICACIONES EN SU CASA: Medicamentos  Tome los medicamentos de venta libre y los recetados solamente como se lo haya indicado el mdico.  Si le recetaron un antiviral, tmelo como se lo haya indicado el mdico. No deje de tomar los medicamentos aunque comience a Actor.  Infrmese sobre cundo los antibiticos son necesarios y cundo no. Los antibiticos no combaten a los virus. Si el mdico cree que usted puede tener una infeccin bacteriana as como una viral, tal vez le receten un antibitico. ? No solicite una receta de antibiticos si le han diagnosticado una enfermedad viral. Eso no har que la enfermedad pase ms rpidamente. ? Tomar antibiticos con frecuencia cuando no son necesarios puede derivar en resistencia a los antibiticos. Cuando esto ocurre, el medicamento pierde su eficacia contra las bacterias que normalmente combate. Instrucciones generales  Beba suficiente lquido para mantener la orina clara o de color amarillo plido.  Descanse todo lo que pueda.  Retome sus actividades normales como se lo haya indicado el mdico. Pregntele al mdico qu actividades son seguras para usted.  Concurra a todas las visitas de control como se lo haya indicado el mdico. Esto es importante. CMO SE EVITA ESTO? Tome estas medidas para reducir el riesgo de tener una infeccin viral:  Consuma una dieta sana y descanse mucho.  Lvese las manos frecuentemente con agua y [place]. Esto es especialmente importante cuando est en lugares pblicos. Use desinfectante para manos si no dispone de [place] y [place].  Evite el contacto cercano con  amigos y familiares que tengan una infeccin viral.  Si viaja a las regiones donde las infecciones virales gastrointestinales son frecuentes, no tome agua ni coma alimentos crudos.  Mantngase al da con las vacunas. Vacnese contra la gripe todos los aos como se lo haya indicado el mdico.  No comparta cepillos de dientes, cortaas, rasuradoras o agujas con [employer].  Siempre tenga sexo con proteccin. COMUNQUESE CON UN MDICO SI:  Tiene sntomas de una enfermedad viral que no desaparecen.  Los sntomas regresan despus de haber desaparecido.  Los sntomas empeoran. SOLICITE AYUDA DE INMEDIATO SI:  Tiene dificultad para respirar.  Siente un dolor de cabeza intenso o rigidez en el cuello.  Tiene vmitos fuertes o dolor abdominal. Esta informacin no tiene Theme park manager el consejo del mdico. Asegrese de hacerle al mdico cualquier pregunta que tenga. Document Revised: 04/07/2016 Document Reviewed: 12/11/2015 Elsevier Patient Education  2020 Elsevier Inc.      Edwina Barth, MD [facility]

## 2019-08-21 NOTE — Patient Instructions (Addendum)
If you have lab work done today you will be contacted with your lab results within the next 2 weeks.  If you have not heard from [place] then please contact us. The fastest way to get your results is to register for My Chart.   IF you received an x-ray today, you will receive an invoice from [facility]. Please contact [facility] at [phone] with questions or concerns regarding your invoice.   IF you received labwork today, you will receive an invoice from [place]. Please contact [facility] at [phone] with questions or concerns regarding your invoice.   Our billing staff will not be able to assist you with questions regarding bills from these companies.  You will be contacted with the lab results as soon as they are available. The fastest way to get your results is to activate your My Chart account. Instructions are located on the last page of this paperwork. If you have not heard from [place] regarding the results in 2 weeks, please contact this office.     Enfermedades virales en los adultos (Viral Illness, Adult) Los virus son microbios diminutos que entran en el organismo de Neomia Dear persona y Doctor, general practice. Hay muchos tipos de virus diferentes y causan muchas clases de enfermedades. Las enfermedades virales pueden ser leves o graves. Pueden afectar diferentes partes del cuerpo. Las enfermedades frecuentes causadas por virus incluyen los resfros y Emergency planning/management officer. Adems, las enfermedades virales abarcan cuadros clnicos graves, como el VIH/sida (virus de inmunodeficiencia humana/sndrome de inmunodeficiencia adquirida). Se han identificado unos pocos virus asociados con determinados tipos de cncer. CULES SON LAS CAUSAS? Muchos tipos de virus pueden causar enfermedades. Los virus invaden las clulas del organismo, se multiplican y Estate agent la disfuncin o la muerte de las clulas infectadas. Cuando la clula muere, libera ms virus. Cuando esto ocurre, aparecen sntomas  de la enfermedad, y el virus sigue diseminndose a otras clulas. Si el virus asume la funcin de la clula, puede hacer que esta se divida y crezca fuera de control, y este es el caso en el que un virus causa cncer. Los diferentes virus ingresan al organismo de [employer]. Puede contraer un virus de la siguiente [place]:  Al ingerir alimentos o beber agua contaminados con el virus.  Al inhalar gotitas que una persona infectada liber en el aire al toser o estornudar.  Al tocar una superficie contaminada con el virus y [facility] mano a la boca, la nariz o los ojos.  Al ser picado por un insecto o mordido por un animal que son portadores del virus.  Al tener contacto sexual con Neomia Dear persona infectada por el virus.  Al tener contacto con sangre o lquidos que contienen el virus, ya sea a travs de un corte abierto o durante una transfusin. Si el virus ingresa al organismo, el sistema de defensa del cuerpo (sistema inmunitario) Product/process development scientist. Puede correr un riesgo ms alto de tener una enfermedad viral si tiene el sistema inmunitario debilitado. CULES SON LOS SIGNOS O LOS SNTOMAS? Los sntomas varan en funcin del tipo de virus y de la ubicacin de las clulas que este invade. Los sntomas frecuentes de los principales tipos de enfermedades virales incluyen los siguientes: Virus del resfro y de la gripe  [place].  Dolor de [place].  Dolor de Advertising copywriter.  Dolores musculares.  Congestin nasal.  Tos. Virus del aparato digestivo (gastrointestinales)  [place].  Dolor abdominal.  Nuseas.  Diarrea. Virus hepticos (hepatitis)  Prdida del apetito.  Cansancio.  Tono amarillento de la piel (ictericia). Virus del cerebro y la mdula espinal  [place].  Dolor de [place].  Rigidez en el cuello.  Nuseas y vmitos.  Confusin o somnolencia. Virus de la piel  [place].  Picazn.  Erupcin cutnea. Virus de transmisin  sexual  Secrecin.  Hinchazn.  Enrojecimiento.  Erupcin cutnea. CMO SE TRATA ESTA AFECCIN? Los virus pueden ser difciles de tratar porque se hospedan en las clulas. Los antibiticos no tratan los virus porque no llegan al interior de las clulas. El tratamiento de una enfermedad viral puede incluir lo siguiente:  Lawyer y beber abundantes lquidos.  Medicamentos para [employer]. Estos pueden incluir medicamentos de venta libre para Chief Technology Officer y la [place], medicamentos para la tos o la congestin y medicamentos para Actuary.  Medicamentos antivirales. Estos frmacos estn disponibles nicamente para determinados tipos de virus. Pueden ayudar a [employer] de la gripe, si se toman apenas comienza el cuadro. Tambin hay antivirales para la hepatitis y el VIH/sida. Algunas enfermedades virales pueden evitarse con vacunas. Un ejemplo frecuente es la vacuna antigripal. SIGA ESTAS INDICACIONES EN SU CASA: Medicamentos  Tome los medicamentos de venta libre y los recetados solamente como se lo haya indicado el mdico.  Si le recetaron un antiviral, tmelo como se lo haya indicado el mdico. No deje de tomar los medicamentos aunque comience a Actor.  Infrmese sobre cundo los antibiticos son necesarios y cundo no. Los antibiticos no combaten a los virus. Si el mdico cree que usted puede tener una infeccin bacteriana as como una viral, tal vez le receten un antibitico. ? No solicite una receta de antibiticos si le han diagnosticado una enfermedad viral. Eso no har que la enfermedad pase ms rpidamente. ? Tomar antibiticos con frecuencia cuando no son necesarios puede derivar en resistencia a los antibiticos. Cuando esto ocurre, el medicamento pierde su eficacia contra las bacterias que normalmente combate. Instrucciones generales  Beba suficiente lquido para mantener la orina clara o de color amarillo plido.  Descanse todo lo que  pueda.  Retome sus actividades normales como se lo haya indicado el mdico. Pregntele al mdico qu actividades son seguras para usted.  Concurra a todas las visitas de control como se lo haya indicado el mdico. Esto es importante. CMO SE EVITA ESTO? Tome estas medidas para reducir el riesgo de tener una infeccin viral:  Consuma una dieta sana y descanse mucho.  Lvese las manos frecuentemente con agua y [place]. Esto es especialmente importante cuando est en lugares pblicos. Use desinfectante para manos si no dispone de [place] y [place].  Evite el contacto cercano con amigos y familiares que tengan una infeccin viral.  Si viaja a las regiones donde las infecciones virales gastrointestinales son frecuentes, no tome agua ni coma alimentos crudos.  Mantngase al da con las vacunas. Vacnese contra la gripe todos los aos como se lo haya indicado el mdico.  No comparta cepillos de dientes, cortaas, rasuradoras o agujas con [employer].  Siempre tenga sexo con proteccin. COMUNQUESE CON UN MDICO SI:  Tiene sntomas de una enfermedad viral que no desaparecen.  Los sntomas regresan despus de haber desaparecido.  Los sntomas empeoran. SOLICITE AYUDA DE INMEDIATO SI:  Tiene dificultad para respirar.  Siente un dolor de cabeza intenso o rigidez en el cuello.  Tiene vmitos fuertes o dolor abdominal. Esta informacin no tiene Theme park manager el consejo del mdico. Asegrese de hacerle al mdico cualquier pregunta que tenga. Document Revised: 04/07/2016 Document  Reviewed: 12/11/2015 Elsevier Patient Education  [employer].

## 2019-08-22 ENCOUNTER — Encounter: Payer: Self-pay | Admitting: Emergency Medicine

## 2019-08-22 LAB — CBC WITH DIFFERENTIAL/PLATELET
Basophils Absolute: 0.1 10*3/uL (ref 0.0–0.2)
Basos: 1 %
EOS (ABSOLUTE): 0.4 10*3/uL (ref 0.0–0.4)
Eos: 5 %
Hematocrit: 44 % (ref 34.0–46.6)
Hemoglobin: 15 g/dL (ref 11.1–15.9)
Immature Grans (Abs): 0 10*3/uL (ref 0.0–0.1)
Immature Granulocytes: 0 %
Lymphocytes Absolute: 4 10*3/uL — ABNORMAL HIGH (ref 0.7–3.1)
Lymphs: 52 %
MCH: 29.4 pg (ref 26.6–33.0)
MCHC: 34.1 g/dL (ref 31.5–35.7)
MCV: 86 fL (ref 79–97)
Monocytes Absolute: 0.5 10*3/uL (ref 0.1–0.9)
Monocytes: 6 %
Neutrophils Absolute: 2.8 10*3/uL (ref 1.4–7.0)
Neutrophils: 36 %
Platelets: 230 10*3/uL (ref 150–450)
RBC: 5.1 x10E6/uL (ref 3.77–5.28)
RDW: 13.8 % (ref 11.7–15.4)
WBC: 7.8 10*3/uL (ref 3.4–10.8)

## 2019-08-22 LAB — COMPREHENSIVE METABOLIC PANEL
ALT: 45 IU/L — ABNORMAL HIGH (ref 0–32)
AST: 40 IU/L (ref 0–40)
Albumin/Globulin Ratio: 1.2 (ref 1.2–2.2)
Albumin: 4.8 g/dL (ref 3.8–4.8)
Alkaline Phosphatase: 93 IU/L (ref 39–117)
BUN/Creatinine Ratio: 21 (ref 9–23)
BUN: 13 mg/dL (ref 6–24)
Bilirubin Total: 0.2 mg/dL (ref 0.0–1.2)
CO2: 22 mmol/L (ref 20–29)
Calcium: 9.8 mg/dL (ref 8.7–10.2)
Chloride: 103 mmol/L (ref 96–106)
Creatinine, Ser: 0.61 mg/dL (ref 0.57–1.00)
GFR calc Af Amer: 126 mL/min/{1.73_m2} (ref >59)
GFR calc non Af Amer: 109 mL/min/{1.73_m2} (ref >59)
Globulin, Total: 4 g/dL (ref 1.5–4.5)
Glucose: 108 mg/dL — ABNORMAL HIGH (ref 65–99)
Potassium: 4.2 mmol/L (ref 3.5–5.2)
Sodium: 140 mmol/L (ref 134–144)
Total Protein: 8.8 g/dL — ABNORMAL HIGH (ref 6.0–8.5)

## 2019-08-22 LAB — SAR COV2 SEROLOGY (COVID19)AB(IGG),IA: DiaSorin SARS-CoV-2 Ab, IgG: POSITIVE — AB

## 2019-09-18 ENCOUNTER — Ambulatory Visit (INDEPENDENT_AMBULATORY_CARE_PROVIDER_SITE_OTHER): Payer: 59 | Admitting: Emergency Medicine

## 2019-09-18 ENCOUNTER — Other Ambulatory Visit: Payer: Self-pay

## 2019-09-18 ENCOUNTER — Encounter: Payer: Self-pay | Admitting: Emergency Medicine

## 2019-09-18 VITALS — BP 128/86 | HR 78 | Temp 98.4°F | Resp 16 | Ht 60.0 in | Wt 140.0 lb

## 2019-09-18 DIAGNOSIS — Z8616 Personal history of COVID-19: Secondary | ICD-10-CM | POA: Diagnosis not present

## 2019-09-18 DIAGNOSIS — B349 Viral infection, unspecified: Secondary | ICD-10-CM | POA: Diagnosis not present

## 2019-09-18 DIAGNOSIS — I10 Essential (primary) hypertension: Secondary | ICD-10-CM | POA: Diagnosis not present

## 2019-09-18 NOTE — Patient Instructions (Addendum)
   If you have lab work done today you will be contacted with your lab results within the next 2 weeks.  If you have not heard from us then please contact us. The fastest way to get your results is to register for My Chart.   IF you received an x-ray today, you will receive an invoice from [facility]. Please contact [facility] at [phone] with questions or concerns regarding your invoice.   IF you received labwork today, you will receive an invoice from [facility]. Please contact [facility] at [phone] with questions or concerns regarding your invoice.   Our billing staff will not be able to assist you with questions regarding bills from these companies.  You will be contacted with the lab results as soon as they are available. The fastest way to get your results is to activate your My Chart account. Instructions are located on the last page of this paperwork. If you have not heard from us regarding the results in 2 weeks, please contact this office.     Mantenimiento de la salud en las mujeres Health Maintenance, Female Adoptar un estilo de vida saludable y recibir atencin preventiva son importantes para promover la salud y el bienestar. Consulte al mdico sobre:  El esquema adecuado para hacerse pruebas y exmenes peridicos.  Cosas que puede hacer por su cuenta para prevenir enfermedades y mantenerse sana. Qu debo saber sobre la dieta, el peso y el ejercicio? Consuma una dieta saludable   Consuma una dieta que incluya muchas verduras, frutas, productos lcteos con bajo contenido de grasa y protenas magras.  No consuma muchos alimentos ricos en grasas slidas, azcares agregados o sodio. Mantenga un peso saludable El ndice de masa muscular (IMC) se utiliza para identificar problemas de peso. Proporciona una estimacin de la grasa corporal basndose en el peso y la altura. Su mdico puede ayudarle a determinar su IMC y a lograr o mantener un peso  saludable. Haga ejercicio con regularidad Haga ejercicio con regularidad. Esta es una de las prcticas ms importantes que puede hacer por su salud. La mayora de los adultos deben seguir estas pautas:  Realizar, al menos, 150minutos de actividad fsica por semana. El ejercicio debe aumentar la frecuencia cardaca y hacerlo transpirar (ejercicio de intensidad moderada).  Hacer ejercicios de fortalecimiento por lo menos dos veces por semana. Agregue esto a su plan de ejercicio de intensidad moderada.  Pasar menos tiempo sentados. Incluso la actividad fsica ligera puede ser beneficiosa. Controle sus niveles de colesterol y lpidos en la sangre Comience a realizarse anlisis de lpidos y colesterol en la sangre a los 20aos y luego reptalos cada 5aos. Hgase controlar los niveles de colesterol con mayor frecuencia si:  Sus niveles de lpidos y colesterol son altos.  Es mayor de 40aos.  Presenta un alto riesgo de padecer enfermedades cardacas. Qu debo saber sobre las pruebas de deteccin del cncer? Segn su historia clnica y sus antecedentes familiares, es posible que deba realizarse pruebas de deteccin del cncer en diferentes edades. Esto puede incluir pruebas de deteccin de lo siguiente:  Cncer de mama.  Cncer de cuello uterino.  Cncer colorrectal.  Cncer de piel.  Cncer de pulmn. Qu debo saber sobre la enfermedad cardaca, la diabetes y la hipertensin arterial? Presin arterial y enfermedad cardaca  La hipertensin arterial causa enfermedades cardacas y aumenta el riesgo de accidente cerebrovascular. Es ms probable que esto se manifieste en las personas que tienen lecturas de presin arterial alta, tienen ascendencia africana o   tienen sobrepeso.  Hgase controlar la presin arterial: ? Cada 3 a 5 aos si tiene entre 18 y 39 aos. ? Todos los aos si es mayor de 40aos. Diabetes Realcese exmenes de deteccin de la diabetes con regularidad. Este  anlisis revisa el nivel de azcar en la sangre en ayunas. Hgase las pruebas de deteccin:  Cada tresaos despus de los 40aos de edad si tiene un peso normal y un bajo riesgo de padecer diabetes.  Con ms frecuencia y a partir de una edad inferior si tiene sobrepeso o un alto riesgo de padecer diabetes. Qu debo saber sobre la prevencin de infecciones? Hepatitis B Si tiene un riesgo ms alto de contraer hepatitis B, debe someterse a un examen de deteccin de este virus. Hable con el mdico para averiguar si tiene riesgo de contraer la infeccin por hepatitis B. Hepatitis C Se recomienda el anlisis a:  Todos los que nacieron entre 1945 y 1965.  Todas las personas que tengan un riesgo de haber contrado hepatitis C. Enfermedades de transmisin sexual (ETS)  Hgase las pruebas de deteccin de ITS, incluidas la gonorrea y la clamidia, si: ? Es sexualmente activa y es menor de 24aos. ? Es mayor de 24aos, y el mdico le informa que corre riesgo de tener este tipo de infecciones. ? La actividad sexual ha cambiado desde que le hicieron la ltima prueba de deteccin y tiene un riesgo mayor de tener clamidia o gonorrea. Pregntele al mdico si usted tiene riesgo.  Pregntele al mdico si usted tiene un alto riesgo de contraer VIH. El mdico tambin puede recomendarle un medicamento recetado para ayudar a evitar la infeccin por el VIH. Si elige tomar medicamentos para prevenir el VIH, primero debe hacerse los anlisis de deteccin del VIH. Luego debe hacerse anlisis cada 3meses mientras est tomando los medicamentos. Embarazo  Si est por dejar de menstruar (fase premenopusica) y usted puede quedar embarazada, busque asesoramiento antes de quedar embarazada.  Tome de 400 a 800microgramos (mcg) de cido flico todos los das si queda embarazada.  Pida mtodos de control de la natalidad (anticonceptivos) si desea evitar un embarazo no deseado. Osteoporosis y menopausia La  osteoporosis es una enfermedad en la que los huesos pierden los minerales y la fuerza por el avance de la edad. El resultado pueden ser fracturas en los huesos. Si tiene 65aos o ms, o si est en riesgo de sufrir osteoporosis y fracturas, pregunte a su mdico si debe:  Hacerse pruebas de deteccin de prdida sea.  Tomar un suplemento de calcio o de vitamina D para reducir el riesgo de fracturas.  Recibir terapia de reemplazo hormonal (TRH) para tratar los sntomas de la menopausia. Siga estas instrucciones en su casa: Estilo de vida  No consuma ningn producto que contenga nicotina o tabaco, como cigarrillos, cigarrillos electrnicos y tabaco de mascar. Si necesita ayuda para dejar de fumar, consulte al mdico.  No consuma drogas.  No comparta agujas.  Solicite ayuda a su mdico si necesita apoyo o informacin para abandonar las drogas. Consumo de alcohol  No beba alcohol si: ? Su mdico le indica no hacerlo. ? Est embarazada, puede estar embarazada o est tratando de quedar embarazada.  Si bebe alcohol: ? Limite la cantidad que consume de 0 a 1 medida por da. ? Limite la ingesta si est amamantando.  Est atento a la cantidad de alcohol que hay en las bebidas que toma. En [place], una medida equivale a una botella de cerveza de 12oz (355ml),   un vaso de vino de 5oz (148ml) o un vaso de una bebida alcohlica de alta graduacin de 1oz (44ml). Instrucciones generales  Realcese los estudios de rutina de la salud, dentales y de la vista.  Mantngase al da con las vacunas.  Infrmele a su mdico si: ? Se siente deprimida con frecuencia. ? Alguna vez ha sido vctima de maltrato o no se siente segura en su casa. Resumen  Adoptar un estilo de vida saludable y recibir atencin preventiva son importantes para promover la salud y el bienestar.  Siga las instrucciones del mdico acerca de una dieta saludable, el ejercicio y la realizacin de pruebas o exmenes  para detectar enfermedades.  Siga las instrucciones del mdico con respecto al control del colesterol y la presin arterial. Esta informacin no tiene como fin reemplazar el consejo del mdico. Asegrese de hacerle al mdico cualquier pregunta que tenga. Document Revised: 08/22/2018 Document Reviewed: 08/22/2018 Elsevier Patient Education  2020 Elsevier Inc.  

## 2019-09-18 NOTE — Progress Notes (Signed)
Patty Howell 49 y.o.   Chief Complaint  Patient presents with  . viral illness    4 weeks follow up - per patient feeling    HISTORY OF PRESENT ILLNESS: This is a 49 y.o. female seen by me on 08/21/2019 with symptoms compatible with a viral illness here for follow-up. Patient with a history of Covid infection last summer, positive serology for IgG. Feels fine and all of her symptoms resolved. No complaints or medical concerns today. Most recent blood work reviewed with patient.  Normal labs.  HPI   Prior to Admission medications   Medication Sig Start Date End Date Taking? Authorizing Provider  amLODipine (NORVASC) 10 MG tablet Take 1 tablet (10 mg total) by mouth daily. 08/15/19  Yes Lamptey, Myrene Galas, MD  pantoprazole (PROTONIX) 20 MG tablet Take 1 tablet (20 mg total) by mouth daily. 08/15/19  Yes Lamptey, Myrene Galas, MD  ondansetron (ZOFRAN ODT) 4 MG disintegrating tablet Take 1 tablet (4 mg total) by mouth every 8 (eight) hours as needed for nausea or vomiting. Patient not taking: Reported on 09/18/2019 08/15/19   Chase Picket, MD  cetirizine (ZYRTEC ALLERGY) 10 MG tablet Take 1 tablet (10 mg total) by mouth daily. 05/29/19 08/15/19  Jaynee Eagles, PA-C  promethazine (PHENERGAN) 25 MG tablet Take 0.5-1 tablets (12.5-25 mg total) by mouth every 8 (eight) hours as needed for nausea or vomiting. 09/13/17 05/29/19  Tereasa Coop, PA-C    No Known Allergies  Patient Active Problem List   Diagnosis Date Noted  . Depressed affect 02/04/2013  . HA (headache) 02/04/2013  . Hypertension 10/23/2011    Past Medical History:  Diagnosis Date  . Asthma   . Depression   . Hypertension     No past surgical history on file.  Social History   Socioeconomic History  . Marital status: Single    Spouse name: Not on file  . Number of children: Not on file  . Years of education: Not on file  . Highest education level: Not on file  Occupational History  . Not on file  Tobacco Use   . Smoking status: Never Smoker  . Smokeless tobacco: Never Used  Substance and Sexual Activity  . Alcohol use: No  . Drug use: No  . Sexual activity: Not on file  Other Topics Concern  . Not on file  Social History Narrative  . Not on file   Social Determinants of Health   Financial Resource Strain:   . Difficulty of Paying Living Expenses: Not on file  Food Insecurity:   . Worried About Charity fundraiser in the Last Year: Not on file  . Ran Out of Food in the Last Year: Not on file  Transportation Needs:   . Lack of Transportation (Medical): Not on file  . Lack of Transportation (Non-Medical): Not on file  Physical Activity:   . Days of Exercise per Week: Not on file  . Minutes of Exercise per Session: Not on file  Stress:   . Feeling of Stress : Not on file  Social Connections:   . Frequency of Communication with Friends and Family: Not on file  . Frequency of Social Gatherings with Friends and Family: Not on file  . Attends Religious Services: Not on file  . Active Member of Clubs or Organizations: Not on file  . Attends Archivist Meetings: Not on file  . Marital Status: Not on file  Intimate Partner Violence:   . Fear  of Current or Ex-Partner: Not on file  . Emotionally Abused: Not on file  . Physically Abused: Not on file  . Sexually Abused: Not on file    Family History  Problem Relation Age of Onset  . Hypertension Mother      Review of Systems  Constitutional: Negative.  Negative for chills, fever, malaise/fatigue and weight loss.  HENT: Negative.  Negative for congestion and sore throat.   Respiratory: Negative.  Negative for cough and shortness of breath.   Cardiovascular: Negative.  Negative for chest pain and palpitations.  Gastrointestinal: Negative.  Negative for abdominal pain, diarrhea, nausea and vomiting.  Genitourinary: Negative.  Negative for dysuria and hematuria.  Musculoskeletal: Negative.  Negative for back pain, myalgias and  neck pain.  Skin: Negative.  Negative for rash.  Neurological: Negative.  Negative for dizziness and headaches.  Endo/Heme/Allergies: Negative.   All other systems reviewed and are negative.  Today's Vitals   09/18/19 1117  BP: 128/86  Pulse: 78  Resp: 16  Temp: 98.4 F (36.9 C)  TempSrc: Temporal  SpO2: 100%  Weight: 140 lb (63.5 kg)  Height: 5' (1.524 m)   Body mass index is 27.34 kg/m.    Physical Exam Vitals reviewed.  Constitutional:      Appearance: Normal appearance.  HENT:     Head: Normocephalic.  Eyes:     Extraocular Movements: Extraocular movements intact.     Pupils: Pupils are equal, round, and reactive to light.  Cardiovascular:     Rate and Rhythm: Normal rate and regular rhythm.     Pulses: Normal pulses.     Heart sounds: Normal heart sounds.  Pulmonary:     Effort: Pulmonary effort is normal.     Breath sounds: Normal breath sounds.  Musculoskeletal:        General: Normal range of motion.     Cervical back: Normal range of motion and neck supple.  Skin:    General: Skin is warm and dry.  Neurological:     General: No focal deficit present.     Mental Status: She is alert and oriented to person, place, and time.  Psychiatric:        Mood and Affect: Mood normal.        Behavior: Behavior normal.      ASSESSMENT & PLAN: Clinically stable.  No medical concerns identified during this visit.  Continue present medication for hypertension and follow-up in 6 months.  Patty Howell was seen today for viral illness.  Diagnoses and all orders for this visit:  Viral illness Comments: Resolved  History of 2019 novel coronavirus disease (COVID-19)  Essential hypertension    Patient Instructions       If you have lab work done today you will be contacted with your lab results within the next 2 weeks.  If you have not heard from [place] then please contact us. The fastest way to get your results is to register for My Chart.   IF you received an  x-ray today, you will receive an invoice from [facility]. Please contact [facility] at [phone] with questions or concerns regarding your invoice.   IF you received labwork today, you will receive an invoice from [facility]. Please contact [facility] at [phone] with questions or concerns regarding your invoice.   Our billing staff will not be able to assist you with questions regarding bills from these companies.  You will be contacted with the lab results as soon as they are available.  The fastest way to get your results is to activate your My Chart account. Instructions are located on the last page of this paperwork. If you have not heard from us regarding the results in 2 weeks, please contact this office.     Mantenimiento de Radiographer, therapeuticla salud en las mujeres Health Maintenance, Female Adoptar un estilo de vida saludable y recibir atencin preventiva son importantes para promover la salud y Counsellorel bienestar. Consulte al mdico sobre:  El esquema adecuado para hacerse pruebas y exmenes peridicos.  Cosas que puede hacer por su cuenta para prevenir enfermedades y Thrivent Financialmantenerse sana. Qu debo saber sobre la dieta, el peso y el ejercicio? Consuma una dieta saludable   Consuma una dieta que incluya muchas verduras, frutas, productos lcteos con bajo contenido de [place]grasa y Associate Professorprotenas magras.  No consuma muchos alimentos ricos en grasas slidas, azcares agregados o sodio. Mantenga un peso saludable El ndice de masa muscular [facility]) se [place] Islandsutiliza para identificar problemas de Liztonpeso. Proporciona una estimacin de la grasa corporal basndose en el peso y la altura. Su mdico puede ayudarle a Engineer, sitedeterminar su IMC y a Personnel officerlograr o Pharmacologistmantener un peso saludable. Haga ejercicio con regularidad Haga ejercicio con regularidad. Esta es una de las prcticas ms importantes que puede hacer por su salud. La mayora de los adultos deben seguir estas pautas:  Education officer, environmentalealizar, al menos, 150minutos de actividad fsica  por semana. El ejercicio debe aumentar la frecuencia cardaca y Media plannerhacerlo transpirar (ejercicio de intensidad moderada).  Hacer ejercicios de fortalecimiento por lo Rite Aidmenos dos veces por semana. Agregue esto a su plan de ejercicio de intensidad moderada.  Pasar menos tiempo sentados. Incluso la actividad fsica ligera puede ser beneficiosa. Controle sus niveles de colesterol y lpidos en la sangre Comience a realizarse anlisis de lpidos y Oncologistcolesterol en la sangre a los 20aos y luego reptalos cada 5aos. Hgase controlar los niveles de colesterol con mayor frecuencia si:  Sus niveles de lpidos y colesterol son altos.  Es mayor de 40aos.  Presenta un alto riesgo de padecer enfermedades cardacas. Qu debo saber sobre las pruebas de deteccin del cncer? Segn su historia clnica y sus antecedentes familiares, es posible que deba realizarse pruebas de deteccin del cncer en diferentes edades. Esto puede incluir pruebas de deteccin de lo siguiente:  Cncer de mama.  Cncer de cuello uterino.  Cncer colorrectal.  Cncer de piel.  Cncer de pulmn. Qu debo saber sobre la enfermedad cardaca, la diabetes y la hipertensin arterial? Presin arterial y enfermedad cardaca  La hipertensin arterial causa enfermedades cardacas y Lesothoaumenta el riesgo de accidente cerebrovascular. Es ms probable que esto se manifieste en las personas que tienen lecturas de presin arterial alta, tienen ascendencia africana o tienen sobrepeso.  Hgase controlar la presin arterial: ? Cada 3 a 5 aos si tiene entre 18 y 3339 aos. ? Todos los aos si es mayor de [place]40aos. Diabetes Realcese exmenes de deteccin de la diabetes con regularidad. Este anlisis revisa el nivel de azcar en la sangre en [place]. Hgase las pruebas de deteccin:  Cada tresaos despus de los 40aos de edad si tiene un peso normal y un bajo riesgo de padecer diabetes.  Con ms frecuencia y a partir de [place] edad inferior si  tiene sobrepeso o un alto riesgo de padecer diabetes. Qu debo saber sobre la prevencin de infecciones? Hepatitis B Si tiene un riesgo ms alto de contraer hepatitis B, debe someterse a un examen de deteccin de este virus. Hable con el mdico para averiguar si  tiene riesgo de contraer la infeccin por hepatitis B. Hepatitis C Se recomienda el anlisis a:  [employer] 1945 y 1965.  Todas las personas que tengan un riesgo de haber contrado hepatitis C. Enfermedades de transmisin sexual (ETS)  Hgase las pruebas de Airline pilot de ITS, incluidas la gonorrea y la clamidia, si: ? Es sexualmente activa y es menor de [place]. ? Es mayor de 24aos, y Public affairs consultant informa que corre riesgo de tener este tipo de infecciones. ? La actividad sexual ha cambiado desde que le hicieron la ltima prueba de deteccin y tiene un riesgo mayor de Warehouse manager clamidia o Copy. Pregntele al mdico si usted tiene riesgo.  Pregntele al mdico si usted tiene un alto riesgo de Primary school teacher VIH. El mdico tambin puede recomendarle un medicamento recetado para ayudar a evitar la infeccin por el VIH. Si elige tomar medicamentos para prevenir el VIH, primero debe [employer] de deteccin del VIH. Luego debe hacerse anlisis cada mientras est tomando los medicamentos. Embarazo  Si est por dejar de Armed forces training and education officer (fase premenopusica) y usted puede quedar [place], busque asesoramiento antes de [place].  Tome de 400 a (mcg) de cido [employer] si [place].  Pida mtodos de control de la natalidad (anticonceptivos) si desea evitar un embarazo no deseado. Osteoporosis y [place] La osteoporosis es una enfermedad en la que los huesos pierden los minerales y la fuerza por el avance de la edad. El resultado pueden ser fracturas en los [place]. Si tiene 65aos o ms, o si est en riesgo de sufrir osteoporosis y fracturas, pregunte a su mdico si debe:   Hacerse pruebas de deteccin de prdida sea.  Tomar un suplemento de calcio o de vitamina D para reducir el riesgo de fracturas.  Recibir terapia de reemplazo hormonal (TRH) para tratar los sntomas de la menopausia. Siga estas instrucciones en su casa: Estilo de vida  No consuma ningn producto que contenga nicotina o tabaco, como cigarrillos, cigarrillos electrnicos y tabaco de Theatre manager. Si necesita ayuda para dejar de fumar, consulte al mdico.  No consuma drogas.  No comparta agujas.  Solicite ayuda a su mdico si necesita apoyo o informacin para abandonar las drogas. Consumo de alcohol  No beba alcohol si: ? Su mdico le indica no hacerlo. ? Est embarazada, puede estar embarazada o est tratando de quedar embarazada.  Si bebe alcohol: ? Limite la cantidad que consume de 0 a 1 medida por da. ? Limite la ingesta si est amamantando.  Est atento a la cantidad de alcohol que hay en las bebidas que toma. En los [place], una medida equivale a una botella de cerveza de 12oz ( ), un vaso de vino de 5oz ( ) o un vaso de una bebida alcohlica de alta graduacin de 1oz (70ml). Instrucciones generales  Realcese los estudios de rutina de la salud, dentales y de Wellsite geologist.  Mantngase al da con las vacunas.  Infrmele a su mdico si: ? Se siente deprimida con frecuencia. ? Alguna vez ha sido vctima de [place] o no se siente segura en su casa. Resumen  Adoptar un estilo de vida saludable y recibir atencin preventiva son importantes para promover la salud y Counsellor.  Siga las instrucciones del mdico acerca de una dieta saludable, el ejercicio y la realizacin de pruebas o exmenes para Hotel manager.  Siga las instrucciones del mdico con respecto al control del colesterol y la presin arterial. Esta informacin no tiene como fin  reemplazar el consejo del mdico. Asegrese de hacerle al mdico cualquier pregunta que tenga. Document  Revised: 08/22/2018 Document Reviewed: 08/22/2018 Elsevier Patient Education  2020 Elsevier Inc.      Edwina Barth, MD [facility]

## 2019-10-11 IMAGING — DX DG CHEST 2V
2 series · 2 of 2 positions shown · non-contrast
Comparison: None.

CLINICAL DATA: Dizziness.

EXAM:
CHEST  2 VIEW

[chest pa]
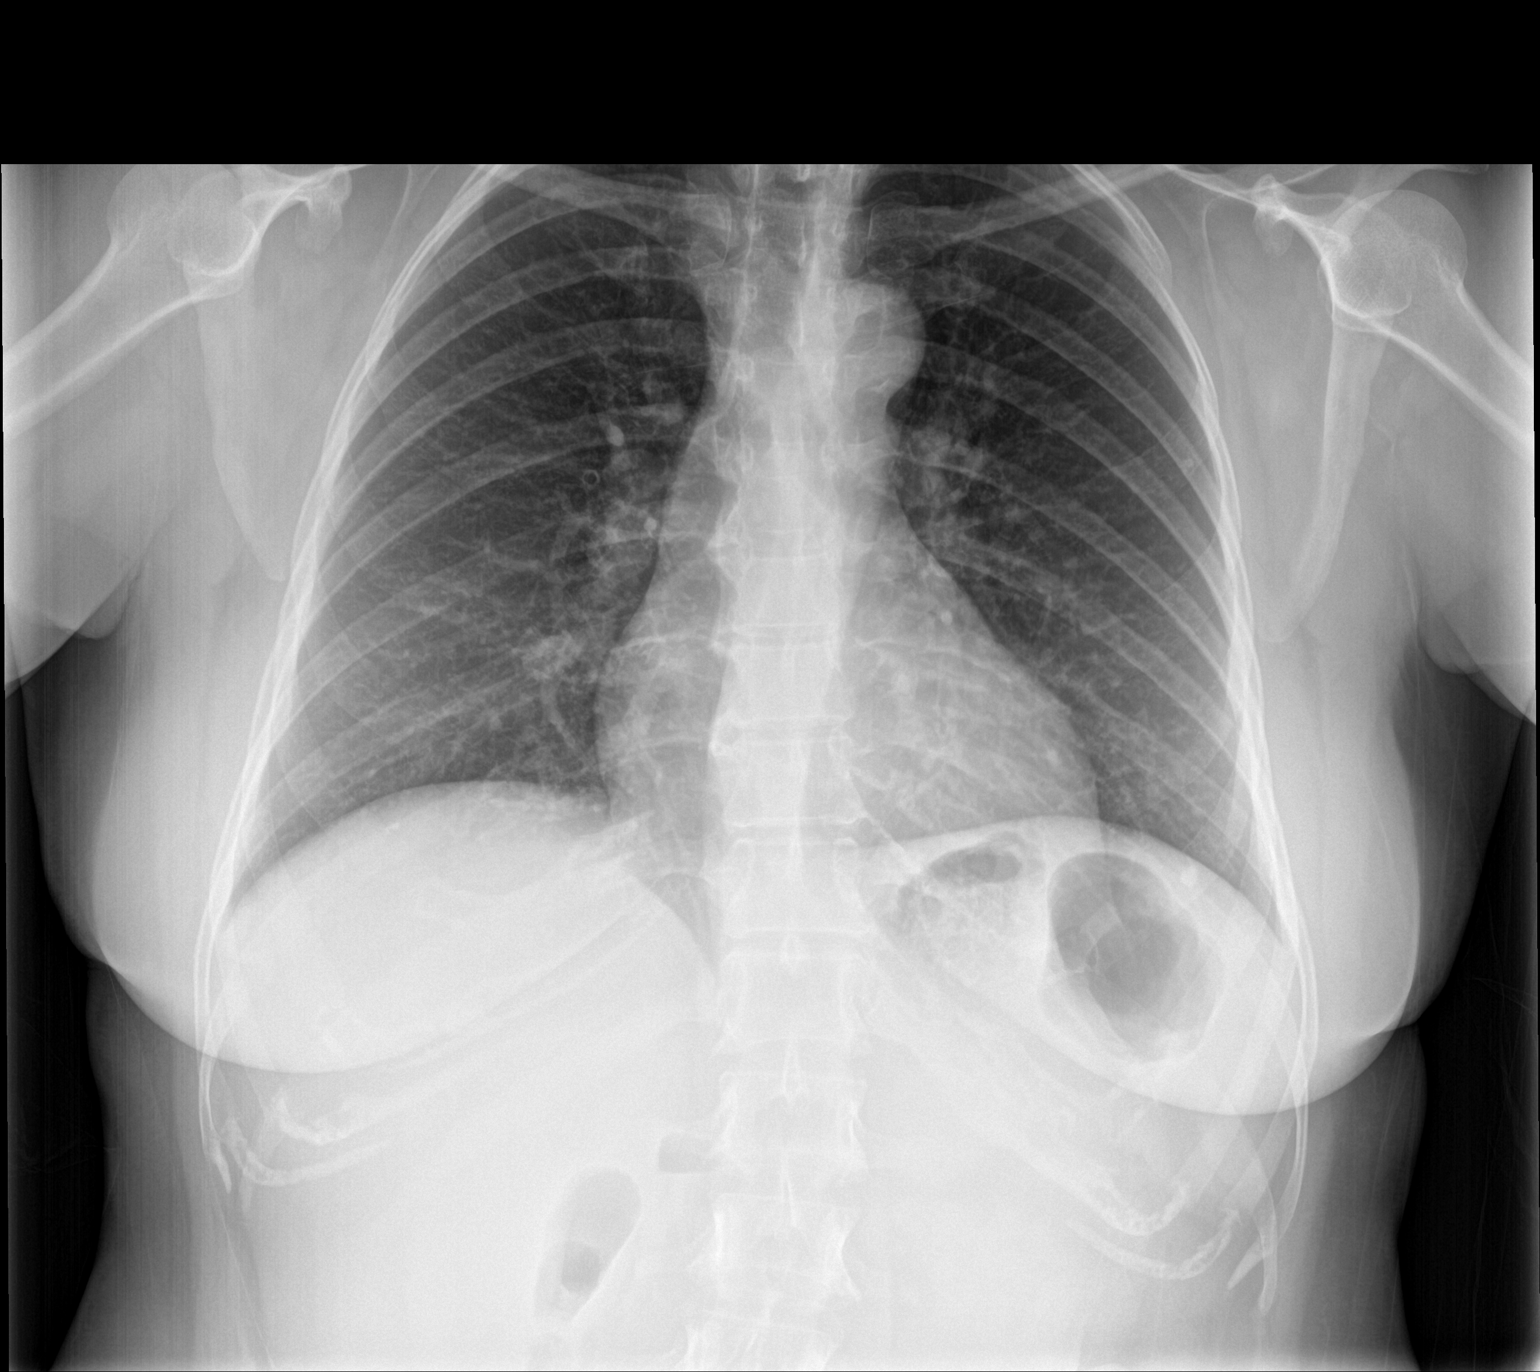

[chest lat]
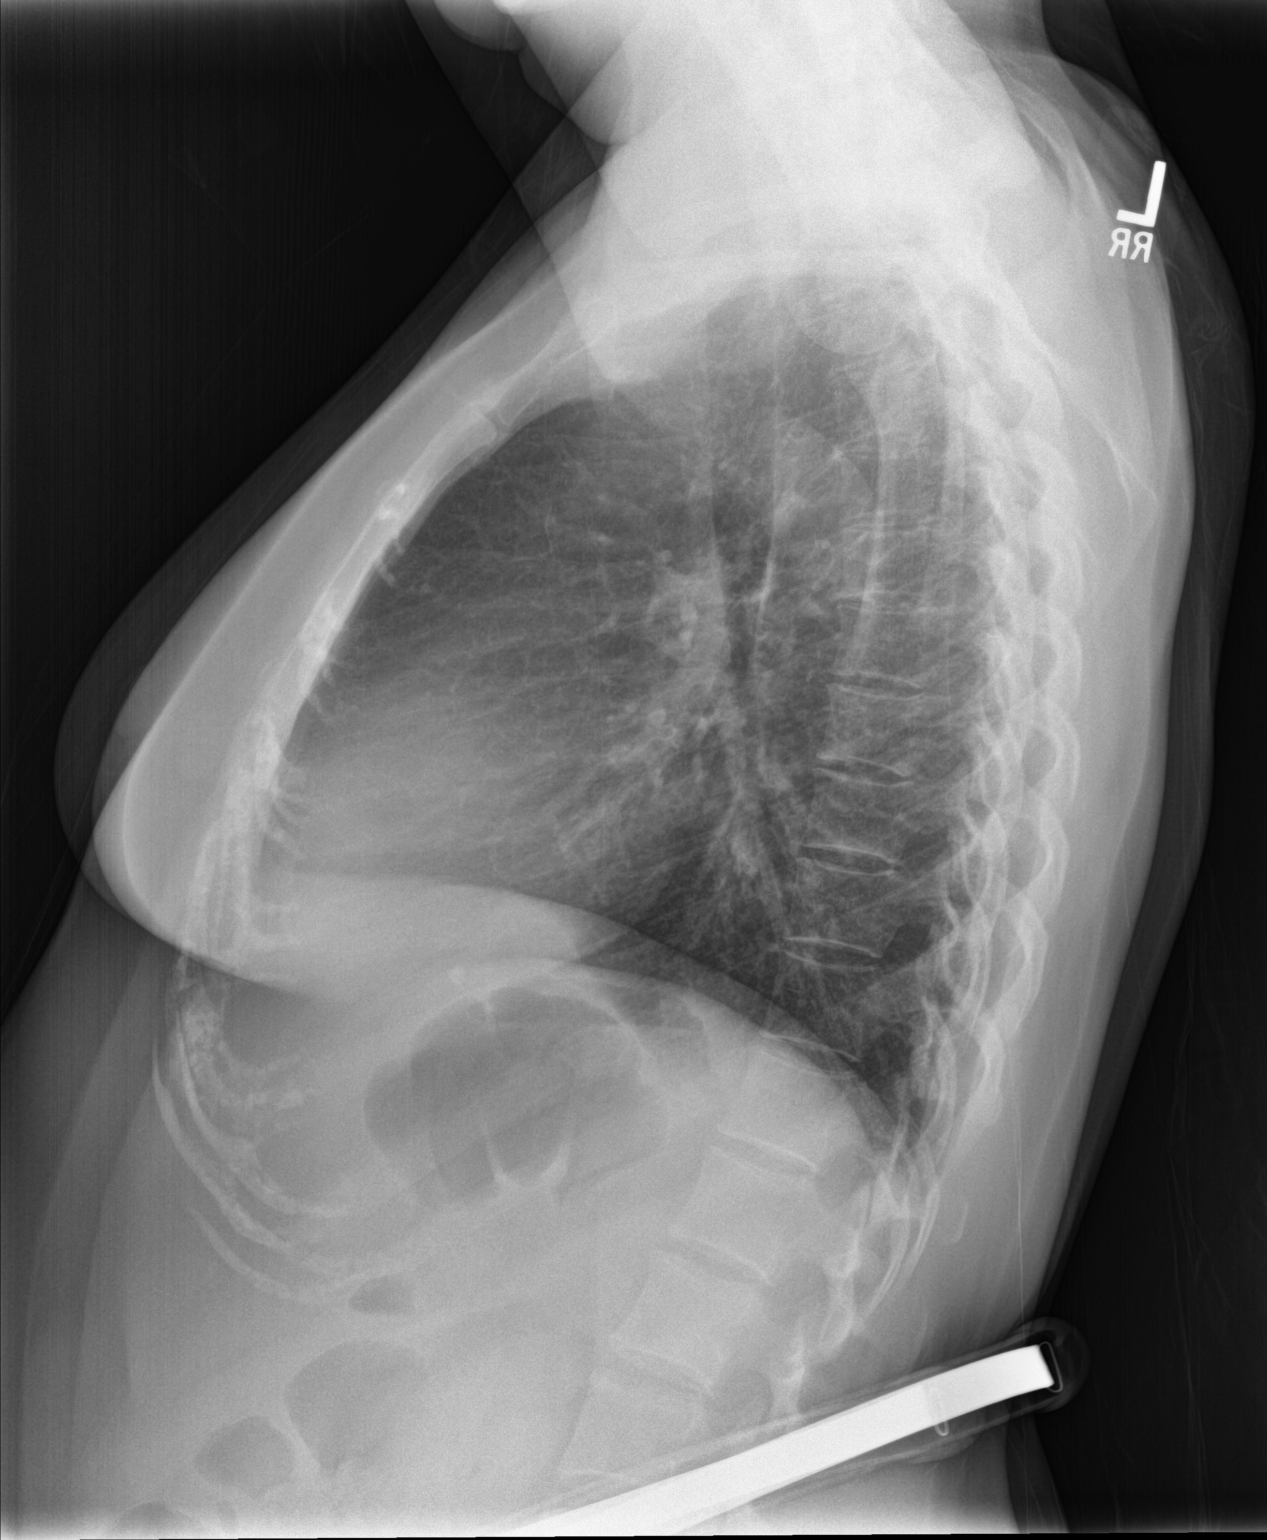

[2 of 2 positions shown; findings below may reference images not displayed]

FINDINGS: Lungs clear. Heart size normal. No pneumothorax or pleural effusion.
No bony abnormality.
IMPRESSION: Negative chest.

## 2020-03-18 ENCOUNTER — Encounter: Payer: Self-pay | Admitting: Emergency Medicine

## 2020-03-18 ENCOUNTER — Other Ambulatory Visit: Payer: Self-pay

## 2020-03-18 ENCOUNTER — Ambulatory Visit (INDEPENDENT_AMBULATORY_CARE_PROVIDER_SITE_OTHER): Payer: 59 | Admitting: Emergency Medicine

## 2020-03-18 VITALS — BP 130/82 | HR 69 | Temp 98.2°F | Resp 16 | Ht 60.0 in | Wt 136.0 lb

## 2020-03-18 DIAGNOSIS — Z1329 Encounter for screening for other suspected endocrine disorder: Secondary | ICD-10-CM

## 2020-03-18 DIAGNOSIS — Z1231 Encounter for screening mammogram for malignant neoplasm of breast: Secondary | ICD-10-CM | POA: Diagnosis not present

## 2020-03-18 DIAGNOSIS — Z0001 Encounter for general adult medical examination with abnormal findings: Secondary | ICD-10-CM

## 2020-03-18 DIAGNOSIS — Z13220 Encounter for screening for lipoid disorders: Secondary | ICD-10-CM | POA: Diagnosis not present

## 2020-03-18 DIAGNOSIS — Z130 Encounter for screening for diseases of the blood and blood-forming organs and certain disorders involving the immune mechanism: Secondary | ICD-10-CM

## 2020-03-18 DIAGNOSIS — Z13228 Encounter for screening for other metabolic disorders: Secondary | ICD-10-CM

## 2020-03-18 DIAGNOSIS — I10 Essential (primary) hypertension: Secondary | ICD-10-CM

## 2020-03-18 MED ORDER — AMLODIPINE BESYLATE 10 MG PO TABS: 10.0000 mg | ORAL_TABLET | Freq: Every day | ORAL | 3 refills | Status: AC

## 2020-03-18 NOTE — Patient Instructions (Addendum)
   If you have lab work done today you will be contacted with your lab results within the next 2 weeks.  If you have not heard from us then please contact us. The fastest way to get your results is to register for My Chart.   IF you received an x-ray today, you will receive an invoice from [facility]. Please contact [facility] at [phone] with questions or concerns regarding your invoice.   IF you received labwork today, you will receive an invoice from [facility]. Please contact [facility] at [phone] with questions or concerns regarding your invoice.   Our billing staff will not be able to assist you with questions regarding bills from these companies.  You will be contacted with the lab results as soon as they are available. The fastest way to get your results is to activate your My Chart account. Instructions are located on the last page of this paperwork. If you have not heard from us regarding the results in 2 weeks, please contact this office.      Health Maintenance, Female Adopting a healthy lifestyle and getting preventive care are important in promoting health and wellness. Ask your health care provider about:  The right schedule for you to have regular tests and exams.  Things you can do on your own to prevent diseases and keep yourself healthy. What should I know about diet, weight, and exercise? Eat a healthy diet   Eat a diet that includes plenty of vegetables, fruits, low-fat dairy products, and lean protein.  Do not eat a lot of foods that are high in solid fats, added sugars, or sodium. Maintain a healthy weight Body mass index (BMI) is used to identify weight problems. It estimates body fat based on height and weight. Your health care provider can help determine your BMI and help you achieve or maintain a healthy weight. Get regular exercise Get regular exercise. This is one of the most important things you can do for your health. Most  adults should:  Exercise for at least 150 minutes each week. The exercise should increase your heart rate and make you sweat (moderate-intensity exercise).  Do strengthening exercises at least twice a week. This is in addition to the moderate-intensity exercise.  Spend less time sitting. Even light physical activity can be beneficial. Watch cholesterol and blood lipids Have your blood tested for lipids and cholesterol at 47 years of age, then have this test every 5 years. Have your cholesterol levels checked more often if:  Your lipid or cholesterol levels are high.  You are older than 47 years of age.  You are at high risk for heart disease. What should I know about cancer screening? Depending on your health history and family history, you may need to have cancer screening at various ages. This may include screening for:  Breast cancer.  Cervical cancer.  Colorectal cancer.  Skin cancer.  Lung cancer. What should I know about heart disease, diabetes, and high blood pressure? Blood pressure and heart disease  High blood pressure causes heart disease and increases the risk of stroke. This is more likely to develop in people who have high blood pressure readings, are of African descent, or are overweight.  Have your blood pressure checked: ? Every 3-5 years if you are 18-39 years of age. ? Every year if you are 40 years old or older. Diabetes Have regular diabetes screenings. This checks your fasting blood sugar level. Have the screening done:  Once every   three years after age 40 if you are at a normal weight and have a low risk for diabetes.  More often and at a younger age if you are overweight or have a high risk for diabetes. What should I know about preventing infection? Hepatitis B If you have a higher risk for hepatitis B, you should be screened for this virus. Talk with your health care provider to find out if you are at risk for hepatitis B infection. Hepatitis  C Testing is recommended for:  Everyone born from 1945 through 1965.  Anyone with known risk factors for hepatitis C. Sexually transmitted infections (STIs)  Get screened for STIs, including gonorrhea and chlamydia, if: ? You are sexually active and are younger than 47 years of age. ? You are older than 47 years of age and your health care provider tells you that you are at risk for this type of infection. ? Your sexual activity has changed since you were last screened, and you are at increased risk for chlamydia or gonorrhea. Ask your health care provider if you are at risk.  Ask your health care provider about whether you are at high risk for HIV. Your health care provider may recommend a prescription medicine to help prevent HIV infection. If you choose to take medicine to prevent HIV, you should first get tested for HIV. You should then be tested every 3 months for as long as you are taking the medicine. Pregnancy  If you are about to stop having your period (premenopausal) and you may become pregnant, seek counseling before you get pregnant.  Take 400 to 800 micrograms (mcg) of folic acid every day if you become pregnant.  Ask for birth control (contraception) if you want to prevent pregnancy. Osteoporosis and menopause Osteoporosis is a disease in which the bones lose minerals and strength with aging. This can result in bone fractures. If you are 65 years old or older, or if you are at risk for osteoporosis and fractures, ask your health care provider if you should:  Be screened for bone loss.  Take a calcium or vitamin D supplement to lower your risk of fractures.  Be given hormone replacement therapy (HRT) to treat symptoms of menopause. Follow these instructions at home: Lifestyle  Do not use any products that contain nicotine or tobacco, such as cigarettes, e-cigarettes, and chewing tobacco. If you need help quitting, ask your health care provider.  Do not use street  drugs.  Do not share needles.  Ask your health care provider for help if you need support or information about quitting drugs. Alcohol use  Do not drink alcohol if: ? Your health care provider tells you not to drink. ? You are pregnant, may be pregnant, or are planning to become pregnant.  If you drink alcohol: ? Limit how much you use to 0-1 drink a day. ? Limit intake if you are breastfeeding.  Be aware of how much alcohol is in your drink. In the [place] one drink equals one 12 oz bottle of beer (355 mL), one 5 oz glass of wine (148 mL), or one 1 oz glass of hard liquor (44 mL). General instructions  Schedule regular health, dental, and eye exams.  Stay current with your vaccines.  Tell your health care provider if: ? You often feel depressed. ? You have ever been abused or do not feel safe at home. Summary  Adopting a healthy lifestyle and getting preventive care are important in promoting health and wellness.    Follow your health care provider's instructions about healthy diet, exercising, and getting tested or screened for diseases.  Follow your health care provider's instructions on monitoring your cholesterol and blood pressure. This information is not intended to replace advice given to you by your health care provider. Make sure you discuss any questions you have with your health care provider. Document Revised: 07/25/2018 Document Reviewed: 07/25/2018 Elsevier Patient Education  2020 Elsevier Inc.  Hipertensin en los adultos Hypertension, Adult El trmino hipertensin es otra forma de denominar a la presin arterial elevada. La presin arterial elevada fuerza al corazn a trabajar ms para bombear la sangre. Esto puede causar problemas con el paso del [place]. Una lectura de presin arterial est compuesta por 2 nmeros. Hay un nmero superior (sistlico) sobre un nmero inferior (diastlico). Lo ideal es tener la presin arterial por debajo de 120/80. Las elecciones  saludables pueden ayudar a Personal assistant presin arterial, o tal vez necesite medicamentos para bajarla. Cules son las causas? Se desconoce la causa de esta afeccin. Algunas afecciones pueden estar relacionadas con la presin arterial alta. Qu incrementa el riesgo?  Fumar.  Tener diabetes mellitus tipo 2, colesterol alto, o ambos.  No hacer la cantidad suficiente de actividad fsica o ejercicio.  Tener sobrepeso.  Consumir mucha grasa, azcar, caloras o sal (sodio) en su dieta.  Beber alcohol en exceso.  Tener una enfermedad renal a largo plazo (crnica).  Tener antecedentes familiares de presin arterial alta.  Edad. Los riesgos aumentan con la edad.  Raza. El riesgo es mayor para las Statistician.  Sexo. Antes de los 45aos, los hombres corren ms [employer]. Despus de los 65aos, las mujeres corren ms [employer].  Tener apnea obstructiva del sueo.  Estrs. Cules son los signos o los sntomas?  Es posible que la presin arterial alta puede no cause sntomas. La presin arterial muy alta (crisis hipertensiva) puede provocar: ? Dolor de [place]. ? Sensaciones de preocupacin o nerviosismo (ansiedad). ? Falta de aire. ? Hemorragia nasal. ? Sensacin de Journalist, newspaper (nuseas). ? Vmitos. ? Cambios en la forma de ver. ? Dolor muy intenso en el pecho. ? Convulsiones. Cmo se trata?  Esta afeccin se trata haciendo cambios saludables en el estilo de vida, por ejemplo: ? Consumir alimentos saludables. ? Hacer ms ejercicio. ? Beber menos alcohol.  El mdico puede recetarle medicamentos si los cambios en el estilo de vida no son suficientes para Museum/gallery curator la presin arterial y si: ? El nmero de arriba est por encima de 130. ? El nmero de abajo est por encima de 80.  Su presin arterial personal ideal puede variar. Siga estas instrucciones en su casa: Comida y bebida   Si se lo dicen, siga el plan de  alimentacin de DASH (Dietary Approaches to Stop Hypertension, Maneras de alimentarse para detener la hipertensin). Para seguir este plan: ? Llene la mitad del plato de cada comida con frutas y verduras. ? Llene un cuarto del plato de cada comida con cereales integrales. Los cereales integrales incluyen pasta integral, arroz integral y pan integral. ? Coma y beba productos lcteos con bajo contenido de grasa, como leche descremada o yogur bajo en grasas. ? Llene un cuarto del plato de cada comida con protenas bajas en grasa (magras). Las protenas bajas en grasa incluyen pescado, pollo sin piel, huevos, frijoles y tofu. ? Evite consumir carne grasa, carne curada y procesada, o pollo con piel. ? Evite consumir alimentos prehechos o procesados.  Consuma menos de 1500 mg de sal por da.  No beba alcohol si: ? El mdico le indica que no lo haga. ? Est embarazada, puede estar embarazada o est tratando de quedar embarazada.  Si bebe alcohol: ? Limite la cantidad que bebe a lo siguiente:  De 0 a 1 medida por da para las mujeres.  De 0 a 2 medidas por da para los hombres. ? Est atento a la cantidad de alcohol que hay en las bebidas que toma. En los [place], una medida equivale a una botella de cerveza de 12oz ( ), un vaso de vino de 5oz ( ) o un vaso de una bebida alcohlica de alta graduacin de 1oz (8ml). Estilo de vida   Trabaje con su mdico para mantenerse en un peso saludable o para perder peso. Pregntele a su mdico cul es el peso recomendable para usted.  Haga al menos de ejercicio la [employer] de la [place]. Estos pueden incluir caminar, nadar o andar en bicicleta.  Realice al menos 30 minutos de ejercicio que fortalezca sus msculos (ejercicios de resistencia) al menos 3 das a la [place]. Estos pueden incluir levantar pesas o hacer Pilates.  No consuma ningn producto que contenga nicotina o tabaco, como cigarrillos, cigarrillos  electrnicos y tabaco de Theatre manager. Si necesita ayuda para dejar de fumar, consulte al [employer].  Controle su presin arterial en su casa tal como le indic el mdico.  Concurra a todas las visitas de seguimiento como se lo haya indicado el mdico. Esto es importante. Medicamentos  [employer] de venta libre y los recetados solamente como se lo haya indicado el mdico. Siga cuidadosamente las indicaciones.  No omita las dosis de medicamentos para la presin arterial. Los medicamentos pierden eficacia si omite dosis. El hecho de omitir las dosis tambin [place] el riesgo de otros problemas.  Pregntele a su mdico a qu efectos secundarios o reacciones a los Museum/gallery curator. Comunquese con un mdico si:  Piensa que tiene [place] reaccin a los medicamentos que est tomando.  Tiene dolores de cabeza frecuentes (recurrentes).  Se siente mareado.  Tiene hinchazn en los tobillos.  Tiene problemas de visin. Solicite ayuda inmediatamente si:  Siente un dolor de cabeza muy intenso.  Empieza a sentirse desorientado (confundido).  Se siente dbil o adormecido.  Siente que va a desmayarse.  Tiene un dolor muy intenso en las siguientes zonas: ? Pecho. ? Vientre (abdomen).  Vomita ms de una vez.  Tiene dificultad para respirar. Resumen  El trmino hipertensin es otra forma de denominar a la presin arterial elevada.  La presin arterial elevada fuerza al corazn a trabajar ms para bombear la sangre.  Para la [employer], una presin arterial normal es menor que 120/80.  Las decisiones saludables pueden ayudarle a disminuir su presin arterial. Si no puede bajar su presin arterial mediante decisiones saludables, es posible que deba tomar medicamentos. Esta informacin no tiene Theme park manager el consejo del mdico. Asegrese de hacerle al mdico cualquier pregunta que tenga. Document Revised: 05/17/2018 Document Reviewed:  05/17/2018 Elsevier Patient Education  2020 [employer].

## 2020-03-18 NOTE — Progress Notes (Signed)
Patty Howell 49 y.o.   Chief Complaint  Patient presents with  . Annual Exam  . Medication Refill    Amlodipine    HISTORY OF PRESENT ILLNESS: This is a 49 y.o. female here for her annual exam. Has history of hypertension on amlodipine 10 mg daily but not taking it as prescribed.  Occasionally skips days. Fully vaccinated against Covid. Complaining of chronic pain to left foot. No other complaints or medical concerns today.  HPI   Prior to Admission medications   Medication Sig Start Date End Date Taking? Authorizing Provider  amLODipine (NORVASC) 10 MG tablet Take 1 tablet (10 mg total) by mouth daily. 08/15/19  Yes Lamptey, Myrene Galas, MD  ondansetron (ZOFRAN ODT) 4 MG disintegrating tablet Take 1 tablet (4 mg total) by mouth every 8 (eight) hours as needed for nausea or vomiting. Patient not taking: Reported on 03/18/2020 08/15/19   Chase Picket, MD  pantoprazole (PROTONIX) 20 MG tablet Take 1 tablet (20 mg total) by mouth daily. Patient not taking: Reported on 03/18/2020 08/15/19   Chase Picket, MD  cetirizine (ZYRTEC ALLERGY) 10 MG tablet Take 1 tablet (10 mg total) by mouth daily. 05/29/19 08/15/19  Jaynee Eagles, PA-C  promethazine (PHENERGAN) 25 MG tablet Take 0.5-1 tablets (12.5-25 mg total) by mouth every 8 (eight) hours as needed for nausea or vomiting. 09/13/17 05/29/19  Tereasa Coop, PA-C    No Known Allergies  Patient Active Problem List   Diagnosis Date Noted  . Hypertension 10/23/2011    Past Medical History:  Diagnosis Date  . Asthma   . Depression   . Hypertension     History reviewed. No pertinent surgical history.  Social History   Socioeconomic History  . Marital status: Single    Spouse name: Not on file  . Number of children: Not on file  . Years of education: Not on file  . Highest education level: Not on file  Occupational History  . Not on file  Tobacco Use  . Smoking status: Never Smoker  . Smokeless tobacco: Never Used   Substance and Sexual Activity  . Alcohol use: No  . Drug use: No  . Sexual activity: Not on file  Other Topics Concern  . Not on file  Social History Narrative  . Not on file   Social Determinants of Health   Financial Resource Strain:   . Difficulty of Paying Living Expenses:   Food Insecurity:   . Worried About Charity fundraiser in the Last Year:   . Arboriculturist in the Last Year:   Transportation Needs:   . Film/video editor (Medical):   Marland Kitchen Lack of Transportation (Non-Medical):   Physical Activity:   . Days of Exercise per Week:   . Minutes of Exercise per Session:   Stress:   . Feeling of Stress :   Social Connections:   . Frequency of Communication with Friends and Family:   . Frequency of Social Gatherings with Friends and Family:   . Attends Religious Services:   . Active Member of Clubs or Organizations:   . Attends Archivist Meetings:   Marland Kitchen Marital Status:   Intimate Partner Violence:   . Fear of Current or Ex-Partner:   . Emotionally Abused:   Marland Kitchen Physically Abused:   . Sexually Abused:     Family History  Problem Relation Age of Onset  . Hypertension Mother      Review of Systems  Constitutional:  Negative.  Negative for chills and fever.  HENT: Negative.  Negative for congestion and sore throat.   Respiratory: Negative.  Negative for cough and shortness of breath.   Cardiovascular: Negative.  Negative for chest pain and palpitations.  Gastrointestinal: Negative.  Negative for abdominal pain, blood in stool, diarrhea, melena, nausea and vomiting.  Genitourinary: Negative.  Negative for dysuria and hematuria.  Musculoskeletal: Negative.  Negative for back pain, myalgias and neck pain.       Left foot pain  Skin: Negative.  Negative for rash.  Neurological: Negative.  Negative for dizziness and headaches.  All other systems reviewed and are negative.    Physical Exam Vitals reviewed.  Constitutional:      Appearance: Normal  appearance.  HENT:     Head: Normocephalic.     Mouth/Throat:     Mouth: Mucous membranes are moist.     Pharynx: Oropharynx is clear.  Eyes:     Extraocular Movements: Extraocular movements intact.     Conjunctiva/sclera: Conjunctivae normal.     Pupils: Pupils are equal, round, and reactive to light.  Neck:     Vascular: No carotid bruit.  Cardiovascular:     Rate and Rhythm: Normal rate and regular rhythm.     Pulses: Normal pulses.     Heart sounds: Normal heart sounds.  Pulmonary:     Effort: Pulmonary effort is normal.     Breath sounds: Normal breath sounds.  Abdominal:     General: Bowel sounds are normal. There is no distension.     Palpations: Abdomen is soft.     Tenderness: There is no abdominal tenderness.  Musculoskeletal:        General: Normal range of motion.     Cervical back: Normal range of motion and neck supple. No tenderness.     Comments: Left foot: Warm to touch.  No erythema or bruising.  Excellent peripheral pulses and capillary refill.  No swelling or significant tenderness. Rest of lower extremities within normal limits.  Lymphadenopathy:     Cervical: No cervical adenopathy.  Skin:    General: Skin is warm and dry.     Capillary Refill: Capillary refill takes less than 2 seconds.  Neurological:     General: No focal deficit present.     Mental Status: She is alert and oriented to person, place, and time.  Psychiatric:        Mood and Affect: Mood normal.        Behavior: Behavior normal.      ASSESSMENT & PLAN: Patty Howell was seen today for annual exam and medication refill.  Diagnoses and all orders for this visit:  Encounter for general adult medical examination with abnormal findings  Visit for screening mammogram -     MM Digital Screening; Future  Screening for lipoid disorders -     Lipid panel  Screening for endocrine, metabolic and immunity disorder -     CMP14+EGFR -     Hemoglobin A1c  Essential hypertension -      amLODipine (NORVASC) 10 MG tablet; Take 1 tablet (10 mg total) by mouth daily.    Patient Instructions       If you have lab work done today you will be contacted with your lab results within the next 2 weeks.  If you have not heard from [place] then please contact us. The fastest way to get your results is to register for My Chart.   IF you received an x-ray  today, you will receive an invoice from [facility]. Please contact [facility] at [phone] with questions or concerns regarding your invoice.   IF you received labwork today, you will receive an invoice from [place] [facility]. Please contact [facility] at [phone] with questions or concerns regarding your invoice.   Our billing staff will not be able to assist you with questions regarding bills from these companies.  You will be contacted with the lab results as soon as they are available. The fastest way to get your results is to activate your My Chart account. Instructions are located on the last page of this paperwork. If you have not heard from [place] regarding the results in 2 weeks, please contact this office.      Health Maintenance, Female Adopting a healthy lifestyle and getting preventive care are important in promoting health and wellness. Ask your health care provider about:  The right schedule for you to have regular tests and exams.  Things you can do on your own to prevent diseases and keep yourself healthy. What should I know about diet, weight, and exercise? Eat a healthy diet   Eat a diet that includes plenty of vegetables, fruits, low-fat dairy products, and lean protein.  Do not eat a lot of foods that are high in solid fats, added sugars, or sodium. Maintain a healthy weight Body mass index (BMI) is used to identify weight problems. It estimates body fat based on height and weight. Your health care provider can help determine your BMI and help you achieve or maintain a healthy weight. Get  regular exercise Get regular exercise. This is one of the most important things you can do for your health. Most adults should:  Exercise for at least 150 minutes each week. The exercise should increase your heart rate and make you sweat (moderate-intensity exercise).  Do strengthening exercises at least twice a week. This is in addition to the moderate-intensity exercise.  Spend less time sitting. Even light physical activity can be beneficial. Watch cholesterol and blood lipids Have your blood tested for lipids and cholesterol at 47 years of age, then have this test every 5 years. Have your cholesterol levels checked more often if:  Your lipid or cholesterol levels are high.  You are older than 47 years of age.  You are at high risk for heart disease. What should I know about cancer screening? Depending on your health history and family history, you may need to have cancer screening at various ages. This may include screening for:  Breast cancer.  Cervical cancer.  Colorectal cancer.  Skin cancer.  Lung cancer. What should I know about heart disease, diabetes, and high blood pressure? Blood pressure and heart disease  High blood pressure causes heart disease and increases the risk of stroke. This is more likely to develop in people who have high blood pressure readings, are of African descent, or are overweight.  Have your blood pressure checked: ? Every 3-5 years if you are 88-68 years of age. ? Every year if you are 28 years old or older. Diabetes Have regular diabetes screenings. This checks your fasting blood sugar level. Have the screening done:  Once every three years after age 63 if you are at a normal weight and have a low risk for diabetes.  More often and at a younger age if you are overweight or have a high risk for diabetes. What should I know about preventing infection? Hepatitis B If you have a higher risk for hepatitis  B, you should be screened for this  virus. Talk with your health care provider to find out if you are at risk for hepatitis B infection. Hepatitis C Testing is recommended for:  Everyone born from [birth year] through 1963.  Anyone with known risk factors for hepatitis C. Sexually transmitted infections (STIs)  Get screened for STIs, including gonorrhea and chlamydia, if: ? You are sexually active and are younger than 47 years of age. ? You are older than 47 years of age and your health care provider tells you that you are at risk for this type of infection. ? Your sexual activity has changed since you were last screened, and you are at increased risk for chlamydia or gonorrhea. Ask your health care provider if you are at risk.  Ask your health care provider about whether you are at high risk for HIV. Your health care provider may recommend a prescription medicine to help prevent HIV infection. If you choose to take medicine to prevent HIV, you should first get tested for HIV. You should then be tested every 3 months for as long as you are taking the medicine. Pregnancy  If you are about to stop having your period (premenopausal) and you may become pregnant, seek counseling before you get pregnant.  Take 400 to 800 micrograms (mcg) of folic acid every day if you become pregnant.  Ask for birth control (contraception) if you want to prevent pregnancy. Osteoporosis and menopause Osteoporosis is a disease in which the bones lose minerals and strength with aging. This can result in bone fractures. If you are 36 years old or older, or if you are at risk for osteoporosis and fractures, ask your health care provider if you should:  Be screened for bone loss.  Take a calcium or vitamin D supplement to lower your risk of fractures.  Be given hormone replacement therapy (HRT) to treat symptoms of menopause. Follow these instructions at home: Lifestyle  Do not use any products that contain nicotine or tobacco, such as cigarettes,  e-cigarettes, and chewing tobacco. If you need help quitting, ask your health care provider.  Do not use street drugs.  Do not share needles.  Ask your health care provider for help if you need support or information about quitting drugs. Alcohol use  Do not drink alcohol if: ? Your health care provider tells you not to drink. ? You are pregnant, may be pregnant, or are planning to become pregnant.  If you drink alcohol: ? Limit how much you use to 0-1 drink a day. ? Limit intake if you are breastfeeding.  Be aware of how much alcohol is in your drink. In the [place] one drink equals one 12 oz bottle of beer (355 mL), one 5 oz glass of wine (148 mL), or one 1 oz glass of hard liquor (44 mL). General instructions  Schedule regular health, dental, and eye exams.  Stay current with your vaccines.  Tell your health care provider if: ? You often feel depressed. ? You have ever been abused or do not feel safe at home. Summary  Adopting a healthy lifestyle and getting preventive care are important in promoting health and wellness.  Follow your health care provider's instructions about healthy diet, exercising, and getting tested or screened for diseases.  Follow your health care provider's instructions on monitoring your cholesterol and blood pressure. This information is not intended to replace advice given to you by your health care provider. Make sure you discuss any questions you  have with your health care provider. Document Revised: 07/25/2018 Document Reviewed: 07/25/2018 Elsevier Patient Education  [place].  Hipertensin en los adultos Hypertension, Adult El trmino hipertensin es otra forma de denominar a la presin arterial elevada. La presin arterial elevada fuerza al corazn a trabajar ms para bombear la sangre. Esto puede causar problemas con el paso del [place]. Una lectura de presin arterial est compuesta por 2 nmeros. Hay un nmero superior (sistlico)  sobre un nmero inferior (diastlico). Lo ideal es tener la presin arterial por debajo de 120/80. Las elecciones saludables pueden ayudar a Engineer, materials presin arterial, o tal vez necesite medicamentos para bajarla. Cules son las causas? Se desconoce la causa de esta afeccin. Algunas afecciones pueden estar relacionadas con la presin arterial alta. Qu incrementa el riesgo?  Fumar.  Tener diabetes mellitus tipo 2, colesterol alto, o ambos.  No hacer la cantidad suficiente de actividad fsica o ejercicio.  Tener sobrepeso.  Consumir mucha grasa, azcar, caloras o sal (sodio) en su dieta.  Beber alcohol en exceso.  Tener una enfermedad renal a largo plazo (crnica).  Tener antecedentes familiares de presin arterial alta.  Edad. Los riesgos aumentan con la edad.  Raza. El riesgo es mayor para las Retail banker.  Sexo. Antes de los 45aos, los hombres corren ms [employer]. Despus de los 65aos, las mujeres corren ms [employer].  Tener apnea obstructiva del sueo.  Estrs. Cules son los signos o los sntomas?  Es posible que la presin arterial alta puede no cause sntomas. La presin arterial muy alta (crisis hipertensiva) puede provocar: ? Dolor de [place]. ? Sensaciones de preocupacin o nerviosismo (ansiedad). ? Falta de aire. ? Hemorragia nasal. ? Sensacin de Engineer, site (nuseas). ? Vmitos. ? Cambios en la forma de ver. ? Dolor muy intenso en el pecho. ? Convulsiones. Cmo se trata?  Esta afeccin se trata haciendo cambios saludables en el estilo de vida, por ejemplo: ? Consumir alimentos saludables. ? Hacer ms ejercicio. ? Beber menos alcohol.  El mdico puede recetarle medicamentos si los cambios en el estilo de vida no son suficientes para Child psychotherapist la presin arterial y si: ? El nmero de arriba est por encima de 130. ? El nmero de abajo est por encima de 80.  Su presin arterial personal  ideal puede variar. Siga estas instrucciones en su casa: Comida y bebida   Si se lo dicen, siga el plan de alimentacin de DASH (Dietary Approaches to Stop Hypertension, Maneras de alimentarse para detener la hipertensin). Para seguir este plan: ? Llene la mitad del plato de cada comida con frutas y verduras. ? Llene un cuarto del plato de cada comida con cereales integrales. Los cereales integrales incluyen pasta integral, arroz integral y pan integral. ? Coma y beba productos lcteos con bajo contenido de grasa, como leche descremada o yogur bajo en grasas. ? Llene un cuarto del plato de cada comida con protenas bajas en grasa (magras). Las protenas bajas en grasa incluyen pescado, pollo sin piel, huevos, frijoles y tofu. ? Evite consumir carne grasa, carne curada y procesada, o pollo con piel. ? Evite consumir alimentos prehechos o procesados.  Consuma menos de 1500 mg de sal por da.  No beba alcohol si: ? El mdico le indica que no lo haga. ? Est embarazada, puede estar embarazada o est tratando de quedar embarazada.  Si bebe alcohol: ? Limite la cantidad que bebe a lo siguiente:  De 0 a 1  medida por da para las mujeres.  De 0 a 2 medidas por da para los hombres. ? Est atento a la cantidad de alcohol que hay en las bebidas que toma. En los [place], una medida equivale a una botella de cerveza de 12oz (357m), un vaso de vino de 5oz (1459m o un vaso de una bebida alcohlica de alta graduacin de 1oz (44109m Estilo de vida   Trabaje con su mdico para mantenerse en un peso saludable o para perder peso. Pregntele a su mdico cul es el peso recomendable para usted.  Haga al menos 1m22mos de ejercicio la mayoHartford Financialla semaSt. Helenstos pueden incluir caminar, nadar o andar en bicicleta.  Realice al menos 30 minutos de ejercicio que fortalezca sus msculos (ejercicios de resistencia) al menos 3 das a la semaCantontos pueden incluir levantar pesas o  hacer [place].  No consuma ningn producto que contenga nicotina o tabaco, como cigarrillos, cigarrillos electrnicos y tabaco de mascHigher education careers adviser necesita ayuda para dejar de fumar, consulte al mdicMeadWestvacoontrole su presin arterial en su casa tal como le indic el mdico.  Concurra a todas las visitas de seguimiento como se lo haya indicado el mdico. Esto es importante. Medicamentos  TomeDelphiventa libre y los recetados solamente como se lo haya indicado el mdico. Siga cuidadosamente las indicaciones.  No omita las dosis de medicamentos para la presin arterial. Los medicamentos pierden eficacia si omite dosis. El hecho de omitir las dosis tambin aumeSerbiariesgo de otros problemas.  Pregntele a su mdico a qu efectos secundarios o reacciones a los mediCareers information officermunquese con un mdico si:  Piensa que tiene una Mexicoccin a los medicamentos que est tomando.  Tiene dolores de cabeza frecuentes (recurrentes).  Se siente mareado.  Tiene hinchazn en los tobillos.  Tiene problemas de visin. Solicite ayuda inmediatamente si:  Siente un dolor de cabeza muy intenso.  Empieza a sentirse desorientado (confundido).  Se siente dbil o adormecido.  Siente que va a desmayarse.  Tiene un dolor muy intenso en las siguientes zonas: ? Pecho. ? Vientre (abdomen).  Vomita ms de una vez.  Tiene dificultad para respirar. Resumen  El trmino hipertensin es otra forma de denominar a la presin arterial elevada.  La presin arterial elevada fuerza al corazn a trabajar ms para bombear la sangre.  Para la mayoComcasta presin arterial normal es menor que 120/80.  Las decisiones saludables pueden ayudarle a disminuir su presin arterial. Si no puede bajar su presin arterial mediante decisiones saludables, es posible que deba tomar medicamentos. Esta informacin no tiene comoMarine scientistconsejo del mdico. Asegrese de  hacerle al mdico cualquier pregunta que tenga. Document Revised: 05/17/2018 Document Reviewed: 05/17/2018 Elsevier Patient Education  2020 Elsevier Inc.       M[facility]Agustina Carol[facility]

## 2020-03-19 ENCOUNTER — Other Ambulatory Visit: Payer: Self-pay | Admitting: Emergency Medicine

## 2020-03-19 DIAGNOSIS — E785 Hyperlipidemia, unspecified: Secondary | ICD-10-CM

## 2020-03-19 LAB — CMP14+EGFR
ALT: 22 IU/L (ref 0–32)
AST: 24 IU/L (ref 0–40)
Albumin/Globulin Ratio: 1.1 — ABNORMAL LOW (ref 1.2–2.2)
Albumin: 4.1 g/dL (ref 3.8–4.8)
Alkaline Phosphatase: 85 IU/L (ref 48–121)
BUN/Creatinine Ratio: 16 (ref 9–23)
BUN: 10 mg/dL (ref 6–24)
Bilirubin Total: 0.3 mg/dL (ref 0.0–1.2)
CO2: 23 mmol/L (ref 20–29)
Calcium: 9.2 mg/dL (ref 8.7–10.2)
Chloride: 101 mmol/L (ref 96–106)
Creatinine, Ser: 0.64 mg/dL (ref 0.57–1.00)
GFR calc Af Amer: 124 mL/min/{1.73_m2} (ref >59)
GFR calc non Af Amer: 107 mL/min/{1.73_m2} (ref >59)
Globulin, Total: 3.8 g/dL (ref 1.5–4.5)
Glucose: 87 mg/dL (ref 65–99)
Potassium: 4.1 mmol/L (ref 3.5–5.2)
Sodium: 136 mmol/L (ref 134–144)
Total Protein: 7.9 g/dL (ref 6.0–8.5)

## 2020-03-19 LAB — LIPID PANEL
Chol/HDL Ratio: 3.8 ratio (ref 0.0–4.4)
Cholesterol, Total: 235 mg/dL — ABNORMAL HIGH (ref 100–199)
HDL: 62 mg/dL (ref >39)
LDL Chol Calc (NIH): 123 mg/dL — ABNORMAL HIGH (ref 0–99)
Triglycerides: 284 mg/dL — ABNORMAL HIGH (ref 0–149)
VLDL Cholesterol Cal: 50 mg/dL — ABNORMAL HIGH (ref 5–40)

## 2020-03-19 LAB — HEMOGLOBIN A1C
Est. average glucose Bld gHb Est-mCnc: 123 mg/dL
Hgb A1c MFr Bld: 5.9 % — ABNORMAL HIGH (ref 4.8–5.6)

## 2020-03-19 MED ORDER — ROSUVASTATIN CALCIUM 10 MG PO TABS: 10.0000 mg | ORAL_TABLET | Freq: Every day | ORAL | 3 refills | Status: AC

## 2020-04-10 ENCOUNTER — Ambulatory Visit
Admission: RE | Admit: 2020-04-10 | Discharge: 2020-04-10 | Disposition: A | Discharge status: Home / Self Care | Payer: 59 | Source: Ambulatory Visit | Attending: Emergency Medicine | Admitting: Emergency Medicine

## 2020-04-10 ENCOUNTER — Other Ambulatory Visit: Payer: Self-pay

## 2020-04-10 DIAGNOSIS — Z1231 Encounter for screening mammogram for malignant neoplasm of breast: Secondary | ICD-10-CM

## 2020-06-11 ENCOUNTER — Ambulatory Visit: Payer: 59 | Admitting: Emergency Medicine

## 2022-05-08 IMAGING — MG DIGITAL SCREENING BILAT W/ CAD
5 series · 5 of 5 positions shown · non-contrast
Comparison: None.

CLINICAL DATA: Screening.

EXAM:
DIGITAL SCREENING BILATERAL MAMMOGRAM WITH CAD

[L MLO (1 of 2)]
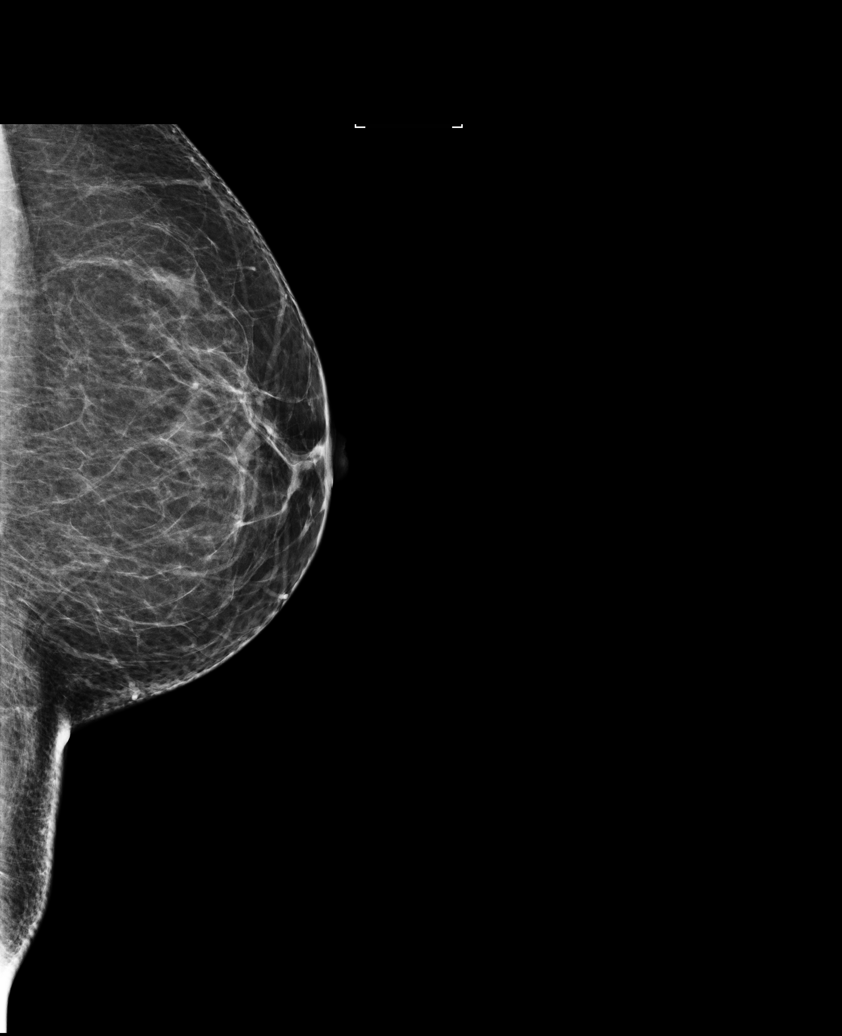

[L MLO (2 of 2)]
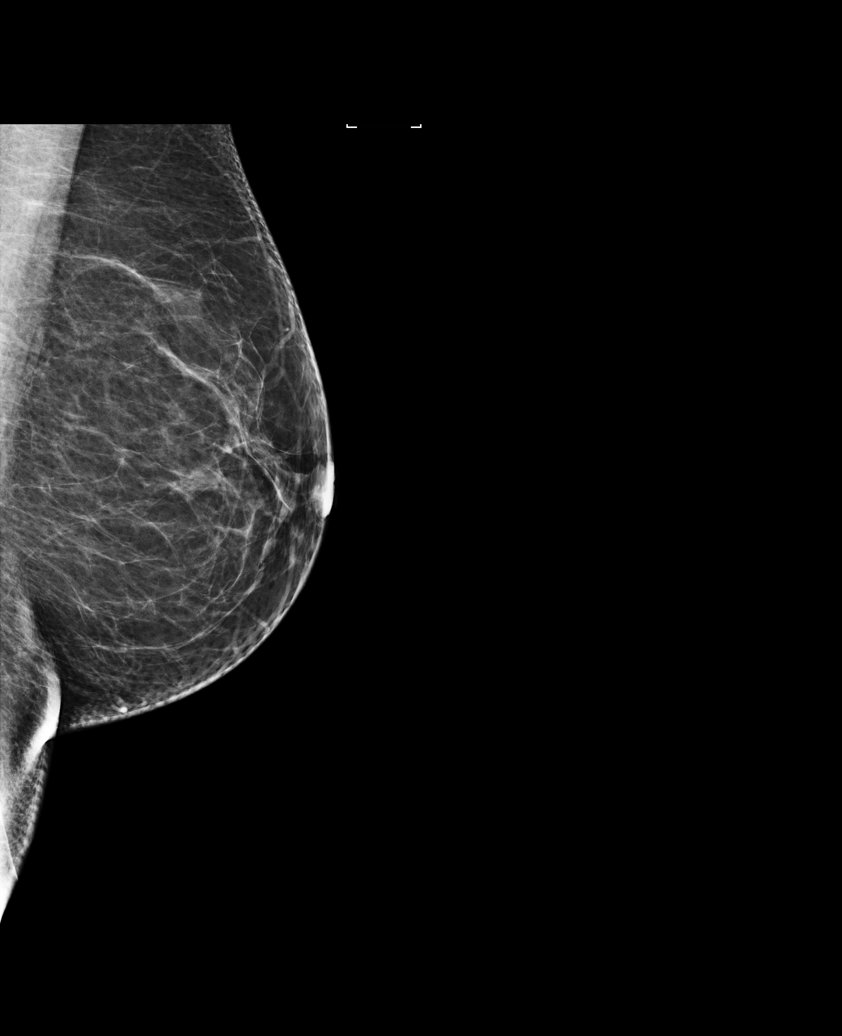

[L CC]
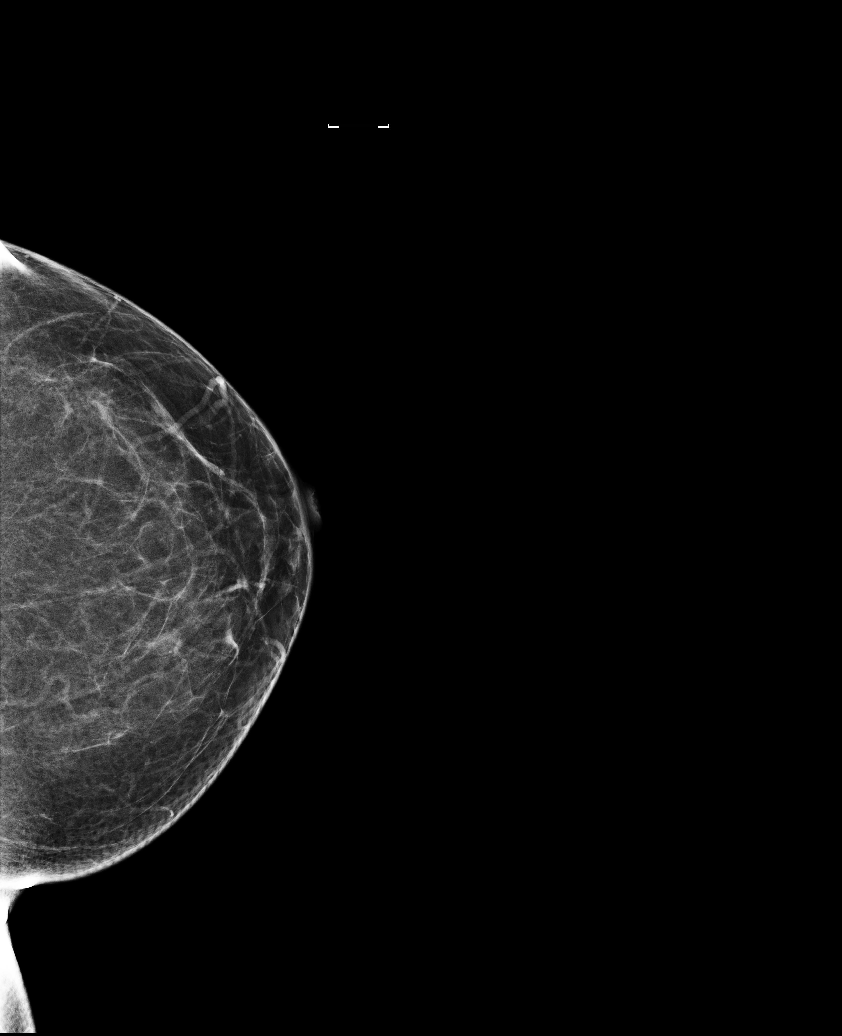

[R CC]
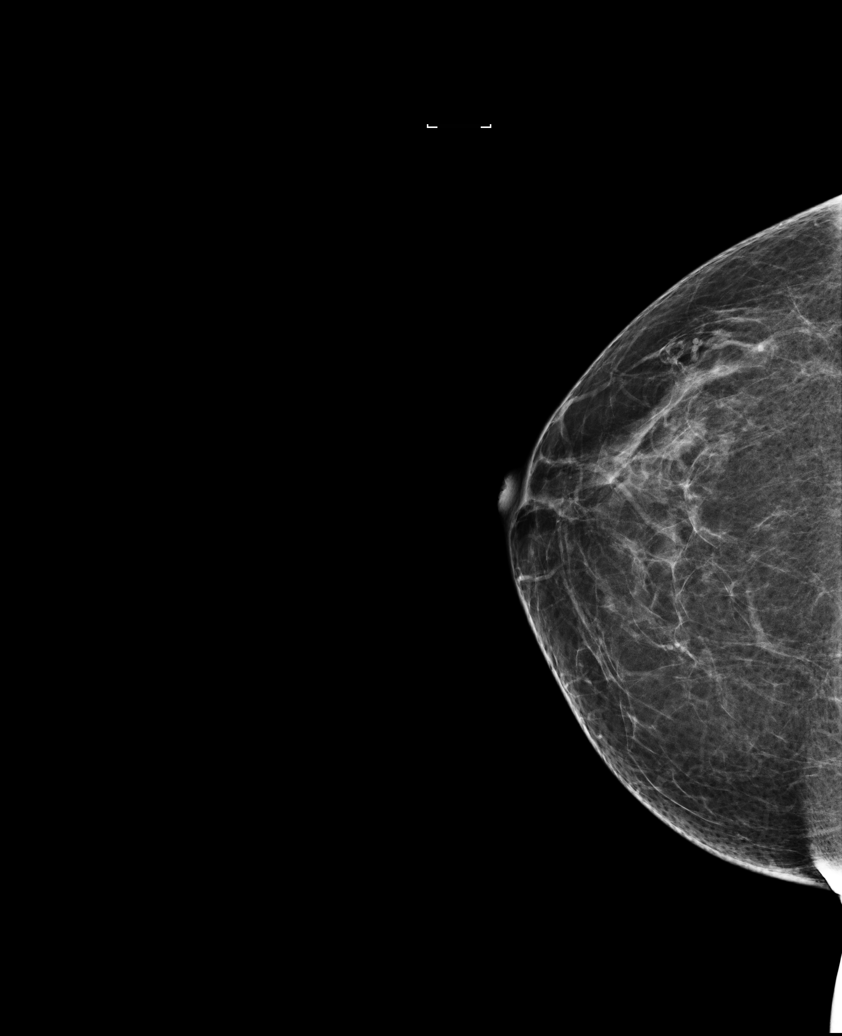

[R MLO]
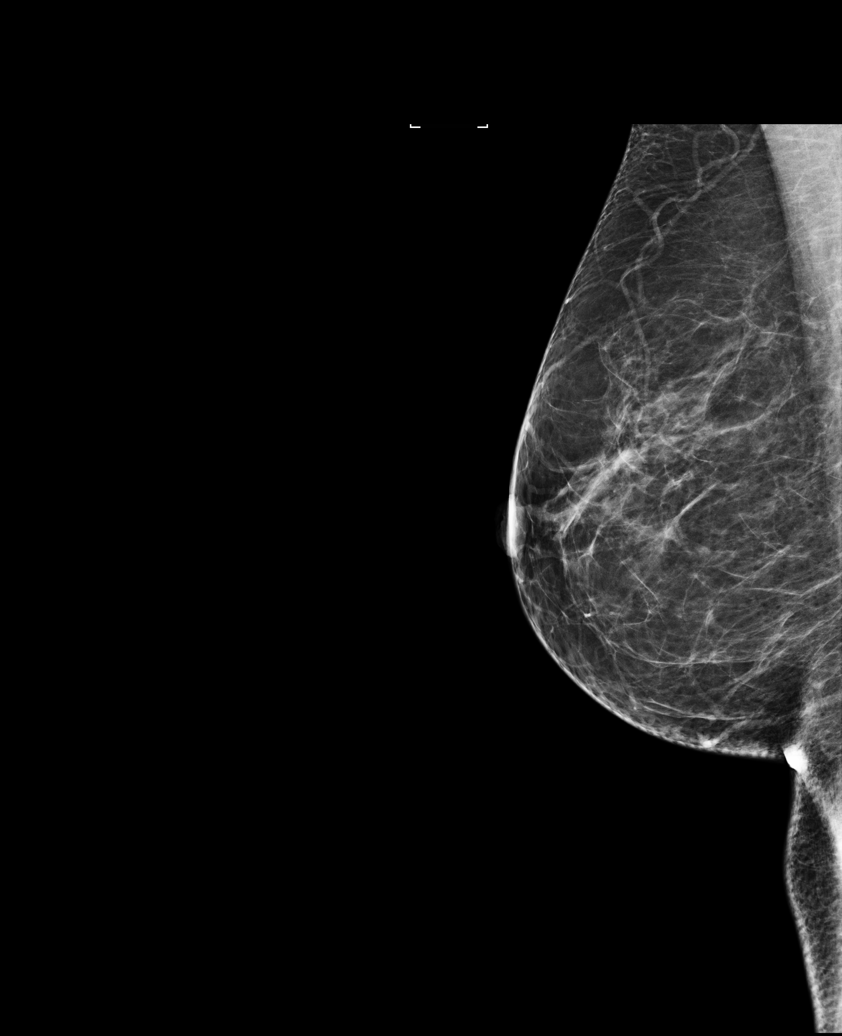

[5 of 5 positions shown; findings below may reference images not displayed]

ACR Breast Density Category b: There are scattered areas of
fibroglandular density.
FINDINGS: There are no findings suspicious for malignancy. Images were
processed with CAD.
IMPRESSION: No mammographic evidence of malignancy. A result letter of this
screening mammogram will be mailed directly to the patient.

RECOMMENDATION:
Screening mammogram in one year. (Code:[id number])

BI-RADS CATEGORY  1: Negative.

## 2023-01-02 ENCOUNTER — Other Ambulatory Visit: Payer: Self-pay

## 2023-01-02 ENCOUNTER — Encounter (HOSPITAL_COMMUNITY): Payer: Self-pay

## 2023-01-02 ENCOUNTER — Emergency Department (HOSPITAL_COMMUNITY)
Admission: EM | Admit: 2023-01-02 | Discharge: 2023-01-03 | Disposition: A | Discharge status: Home / Self Care | Payer: 59 | Attending: Emergency Medicine | Admitting: Emergency Medicine

## 2023-01-02 DIAGNOSIS — J45909 Unspecified asthma, uncomplicated: Secondary | ICD-10-CM | POA: Insufficient documentation

## 2023-01-02 DIAGNOSIS — R1012 Left upper quadrant pain: Secondary | ICD-10-CM | POA: Insufficient documentation

## 2023-01-02 DIAGNOSIS — R300 Dysuria: Secondary | ICD-10-CM | POA: Insufficient documentation

## 2023-01-02 DIAGNOSIS — R110 Nausea: Secondary | ICD-10-CM | POA: Insufficient documentation

## 2023-01-02 DIAGNOSIS — R1013 Epigastric pain: Secondary | ICD-10-CM | POA: Diagnosis not present

## 2023-01-02 DIAGNOSIS — Z79899 Other long term (current) drug therapy: Secondary | ICD-10-CM | POA: Diagnosis not present

## 2023-01-02 DIAGNOSIS — R350 Frequency of micturition: Secondary | ICD-10-CM | POA: Insufficient documentation

## 2023-01-02 DIAGNOSIS — I10 Essential (primary) hypertension: Secondary | ICD-10-CM | POA: Insufficient documentation

## 2023-01-02 LAB — COMPREHENSIVE METABOLIC PANEL
ALT: 30 U/L (ref 0–44)
AST: 29 U/L (ref 15–41)
Albumin: 3.7 g/dL (ref 3.5–5.0)
Alkaline Phosphatase: 68 U/L (ref 38–126)
Anion gap: 9 (ref 5–15)
BUN: 15 mg/dL (ref 6–20)
CO2: 22 mmol/L (ref 22–32)
Calcium: 9.3 mg/dL (ref 8.9–10.3)
Chloride: 104 mmol/L (ref 98–111)
Creatinine, Ser: 0.68 mg/dL (ref 0.44–1.00)
GFR, Estimated: >60 mL/min (ref >60)
Glucose, Bld: 126 mg/dL — ABNORMAL HIGH (ref 70–99)
Potassium: 3.5 mmol/L (ref 3.5–5.1)
Sodium: 135 mmol/L (ref 135–145)
Total Bilirubin: 0.6 mg/dL (ref 0.3–1.2)
Total Protein: 8.2 g/dL — ABNORMAL HIGH (ref 6.5–8.1)

## 2023-01-02 LAB — URINALYSIS, ROUTINE W REFLEX MICROSCOPIC
Bilirubin Urine: NEGATIVE
Glucose, UA: NEGATIVE mg/dL
Ketones, ur: NEGATIVE mg/dL
Leukocytes,Ua: NEGATIVE
Nitrite: NEGATIVE
Protein, ur: 100 mg/dL — AB
Specific Gravity, Urine: 1.023 (ref 1.005–1.030)
pH: 5 (ref 5.0–8.0)

## 2023-01-02 LAB — LIPASE, BLOOD: Lipase: 36 U/L (ref 11–51)

## 2023-01-02 LAB — CBC
HCT: 43 % (ref 36.0–46.0)
Hemoglobin: 14 g/dL (ref 12.0–15.0)
MCH: 29.3 pg (ref 26.0–34.0)
MCHC: 32.6 g/dL (ref 30.0–36.0)
MCV: 90 fL (ref 80.0–100.0)
Platelets: 228 10*3/uL (ref 150–400)
RBC: 4.78 MIL/uL (ref 3.87–5.11)
RDW: 15.1 % (ref 11.5–15.5)
WBC: 9.5 10*3/uL (ref 4.0–10.5)
nRBC: 0 % (ref 0.0–0.2)

## 2023-01-02 LAB — CBG MONITORING, ED: Glucose-Capillary: 124 mg/dL — ABNORMAL HIGH (ref 70–99)

## 2023-01-02 LAB — I-STAT BETA HCG BLOOD, ED (MC, WL, AP ONLY): I-stat hCG, quantitative: <5 m[IU]/mL (ref <5)

## 2023-01-02 MED ORDER — ONDANSETRON HCL 4 MG PO TABS: 4.0000 mg | ORAL_TABLET | Freq: Four times a day (QID) | ORAL | 0 refills | Status: AC

## 2023-01-02 MED ORDER — ONDANSETRON 4 MG PO TBDP
4.0000 mg | ORAL_TABLET | Freq: Once | ORAL | Status: AC
  Administered 2023-01-03: 4 mg via ORAL
  Filled 2023-01-02: qty 1

## 2023-01-02 MED ORDER — ALUM & MAG HYDROXIDE-SIMETH 200-200-20 MG/5ML PO SUSP
30.0000 mL | Freq: Once | ORAL | Status: AC
  Administered 2023-01-03: 30 mL via ORAL
  Filled 2023-01-02: qty 30

## 2023-01-02 MED ORDER — FAMOTIDINE 20 MG PO TABS: 20.0000 mg | ORAL_TABLET | Freq: Two times a day (BID) | ORAL | 0 refills | Status: AC

## 2023-01-02 NOTE — ED Triage Notes (Signed)
Pt c/o abd pain, N/V, dizziness, dysuria, urinary frequency x 2 weeks; sent by [facility] for further evaluation; hx pre-diabetes, HTN; denies fevers

## 2023-01-02 NOTE — ED Provider Triage Note (Signed)
Emergency Medicine Provider Triage Evaluation Note  Patty Howell , a 52 y.o. female  was evaluated in triage.  Patient complains of abdominal pain, vomiting, headache dysuria and urinary frequency.  Sent to urgent care for evaluation due to the severity of the symptoms inclusive of abdominal pain and dizziness as well.  No UTI urgent care however she does have a history of prediabetes.  Review of Systems  Positive:  Negative:   Physical Exam  BP (!) 149/84   Pulse 79   Temp 98.2 F (36.8 C) (Oral)   Resp 16   Ht 5' (1.524 m)   Wt 66.7 kg   SpO2 99%   BMI 28.71 kg/m  Gen:   Awake, no distress   Resp:  Normal effort  MSK:   Moves extremities without difficulty  Other:  Ambulatory in triage  Medical Decision Making  Medically screening exam initiated at 5:07 PM.  Appropriate orders placed.  Tephanie Juhasz was informed that the remainder of the evaluation will be completed by another provider, this initial triage assessment does not replace that evaluation, and the importance of remaining in the ED until their evaluation is complete.     Saddie Benders, PA-C 01/02/23 1708

## 2023-01-02 NOTE — ED Provider Notes (Signed)
[place] EMERGENCY DEPARTMENT AT [facility] Provider Note  CSN: [id number] Arrival date & time: 01/02/23 1604  Chief Complaint(s) Abdominal Pain  HPI Patty Howell is a 52 y.o. female with a past medical history listed below who presents to the emergency department with several weeks of urinary frequency and dysuria.  Patient reports that she is going through menopause.  Last menstrual cycle was 3 months ago.  Patient also reports that she has been having epigastric and left upper quadrant abdominal discomfort for several months.  She is endorsing nausea for several weeks.  No emesis.  No chest pain or shortness of breath.  No recent infections.  No sick contacts.  No suspicious food intake.    The history is provided by the patient.    Past Medical History Past Medical History:  Diagnosis Date   Asthma    Depression    Hypertension    Patient Active Problem List   Diagnosis Date Noted   Hypertension 10/23/2011   Home Medication(s) Prior to Admission medications   Medication Sig Start Date End Date Taking? Authorizing Provider  famotidine (PEPCID) 20 MG tablet Take 1 tablet (20 mg total) by mouth 2 (two) times daily. 01/02/23  Yes Jerrico Covello, Amadeo Garnet, MD  ondansetron (ZOFRAN) 4 MG tablet Take 1 tablet (4 mg total) by mouth every 6 (six) hours. 01/02/23  Yes Dartanyan Deasis, Amadeo Garnet, MD  amLODipine (NORVASC) 10 MG tablet Take 1 tablet (10 mg total) by mouth daily. 03/18/20   Georgina Quint, MD  ondansetron (ZOFRAN ODT) 4 MG disintegrating tablet Take 1 tablet (4 mg total) by mouth every 8 (eight) hours as needed for nausea or vomiting. Patient not taking: Reported on 03/18/2020 08/15/19   Merrilee Jansky, MD  pantoprazole (PROTONIX) 20 MG tablet Take 1 tablet (20 mg total) by mouth daily. Patient not taking: Reported on 03/18/2020 08/15/19   Merrilee Jansky, MD  rosuvastatin (CRESTOR) 10 MG tablet Take 1 tablet (10 mg total) by mouth daily. 03/19/20   Georgina Quint, MD  cetirizine (ZYRTEC ALLERGY) 10 MG tablet Take 1 tablet (10 mg total) by mouth daily. 05/29/19 08/15/19  Wallis Bamberg, PA-C  promethazine (PHENERGAN) 25 MG tablet Take 0.5-1 tablets (12.5-25 mg total) by mouth every 8 (eight) hours as needed for nausea or vomiting. 09/13/17 05/29/19  Ofilia Neas, PA-C                                                                                                                                    Allergies Patient has no known allergies.  Review of Systems Review of Systems As noted in HPI  Physical Exam Vital Signs  I have reviewed the triage vital signs BP (!) 143/75 (BP Location: Left Arm)   Pulse 75   Temp 98.6 F (37 C) (Oral)   Resp 14   Ht 5' (1.524 m)   Wt 62.1 kg  SpO2 100%   BMI 26.76 kg/m   Physical Exam Vitals reviewed.  Constitutional:      General: She is not in acute distress.    Appearance: She is well-developed. She is not diaphoretic.  HENT:     Head: Normocephalic and atraumatic.     Right Ear: External ear normal.     Left Ear: External ear normal.     Nose: Nose normal.  Eyes:     General: No scleral icterus.    Conjunctiva/sclera: Conjunctivae normal.  Neck:     Trachea: Phonation normal.  Cardiovascular:     Rate and Rhythm: Normal rate and regular rhythm.  Pulmonary:     Effort: Pulmonary effort is normal. No respiratory distress.     Breath sounds: No stridor.  Abdominal:     General: There is no distension.     Tenderness: There is abdominal tenderness in the left upper quadrant.  Musculoskeletal:        General: Normal range of motion.     Cervical back: Normal range of motion.  Neurological:     Mental Status: She is alert and oriented to person, place, and time.  Psychiatric:        Behavior: Behavior normal.     ED Results and Treatments Labs (all labs ordered are listed, but only abnormal results are displayed) Labs Reviewed  COMPREHENSIVE METABOLIC PANEL - Abnormal; Notable for  the following components:      Result Value   Glucose, Bld 126 (*)    Total Protein 8.2 (*)    All other components within normal limits  URINALYSIS, ROUTINE W REFLEX MICROSCOPIC - Abnormal; Notable for the following components:   Hgb urine dipstick SMALL (*)    Protein, ur 100 (*)    Bacteria, UA RARE (*)    All other components within normal limits  CBG MONITORING, ED - Abnormal; Notable for the following components:   Glucose-Capillary 124 (*)    All other components within normal limits  LIPASE, BLOOD  CBC  I-STAT BETA HCG BLOOD, ED ([facility], [facility], [facility] ONLY)                                                                                                                         EKG  EKG Interpretation  Date/Time:    Ventricular Rate:    PR Interval:    QRS Duration:   QT Interval:    QTC Calculation:   R Axis:     Text Interpretation:         Radiology No results found.  Medications Ordered in ED Medications  ondansetron (ZOFRAN-ODT) disintegrating tablet 4 mg (has no administration in time range)  alum & mag hydroxide-simeth (MAALOX/MYLANTA) 200-200-20 MG/5ML suspension 30 mL (has no administration in time range)   Procedures Ultrasound ED Abd  Date/Time: 01/02/2023 11:41 PM  Performed by: Nira Conn, MD Authorized by: Nira Conn, MD   Procedure details:    Indications: abdominal  pain     Assessment for:  Gallstones   Hepatobiliary:  Visualized        Hepatobiliary findings:    Common bile duct:  Normal   Gallbladder wall:  Normal   Gallbladder stones: not identified     Sonographic Murphy's sign: negative     (including critical care time) Medical Decision Making / ED Course  Click here for ABCD2, HEART and other calculators  Medical Decision Making Risk OTC drugs. Prescription drug management.    The patient presents to the ED for the following: Upper abd pain Urinary frequency/dysuria  Additional history obtained: [facility] and told to come to ER for GB evaluation  This involves an extensive number of treatment options, and is a complaint that carries with it a high risk of complications and morbidity. The differential diagnosis includes but not limited to:  Upper abdominal pain Gastritis, dyspepsia, peptic ulcer disease, biliary disease, pancreatitis.  Not concerning for ACS or intrathoracic process.   Urinary frequency/dysuria Will rule out urinary tract infection and pregnancy related process.  Co-morbidities/SDOH that complicate the patient evaluation/care: Spanish speaker   Work up Interpretation and Management:   Laboratory Tests ordered listed below with my independent interpretation: CBC without leukocytosis or anemia Metabolic panel without significant electrolyte derangements or renal sufficiency.  No evidence of bili obstruction or pancreatitis Beta-hCG negative UA negative for infection    Imaging Studies ordered listed below with my independent interpretation: Bedside ultrasound as above without evidence of gallstones or signs concerning for acute cholecystitis  ED Course: Abd pain Will treat for GERD/gastritis Urinary frequency/dysuria Not related to infection     Final Clinical Impression(s) / ED Diagnoses Final diagnoses:  Left upper quadrant abdominal pain  Urinary frequency   The patient appears reasonably screened and/or stabilized for discharge and I doubt any other medical condition or other [facility] requiring further screening, evaluation, or treatment in the ED at this time. I have discussed the findings, Dx and Tx plan with the patient/family who expressed understanding and agree(s) with the plan. Discharge instructions discussed at length. The patient/family was given strict return precautions who verbalized understanding of the instructions. No further questions at time of discharge.  Disposition: Discharge  Condition: Good  ED Discharge Orders           Ordered    ondansetron (ZOFRAN) 4 MG tablet  Every 6 hours        01/02/23 2337    famotidine (PEPCID) 20 MG tablet  2 times daily        01/02/23 2337            Follow Up: Georgina Quint, MD [address] [place] [address] [phone]  Call  to schedule an appointment for close follow up           This chart was dictated using voice recognition software.  Despite best efforts to proofread,  errors can occur which can change the documentation meaning.    Nira Conn, MD 01/02/23 [phone]

## 2023-01-04 ENCOUNTER — Telehealth: Payer: Self-pay

## 2023-01-04 NOTE — Transitions of Care (Post Inpatient/ED Visit) (Signed)
   01/04/2023  Name: Patty Howell MRN: [id number] DOB: 1970/09/19  Today's TOC FU Call Status: Today's TOC FU Call Status:: Unsuccessul Call (1st Attempt) Unsuccessful Call (1st Attempt) Date: 01/04/23  Attempted to reach the patient regarding the most recent Inpatient/ED visit.  Follow Up Plan: Additional outreach attempts will be made to reach the patient to complete the Transitions of Care (Post Inpatient/ED visit) call.   Signature Karena Addison, LPN [facility] Nurse Health Advisor Direct Dial [phone]

## 2023-01-05 NOTE — Transitions of Care (Post Inpatient/ED Visit) (Signed)
   01/05/2023  Name: Patty Howell MRN: [id number] DOB: [birth year]  Today's TOC FU Call Status: Today's TOC FU Call Status:: Successful TOC FU Call Competed Unsuccessful Call (1st Attempt) Date: 01/04/23 [facility] FU Call Complete Date: 01/05/23  Transition Care Management Follow-up Telephone Call Date of Discharge: 01/03/23 Discharge Facility: [facility]) Type of Discharge: Emergency Department Reason for ED Visit: Other: (LUQ pain) How have you been since you were released from the hospital?: Better Any questions or concerns?: No  Items Reviewed: Did you receive and understand the discharge instructions provided?: Yes Medications obtained,verified, and reconciled?: Yes (Medications Reviewed) Any new allergies since your discharge?: No Dietary orders reviewed?: Yes Do you have support at home?: Yes People in Home: spouse  Medications Reviewed Today: Medications Reviewed Today     Reviewed by Karena Addison, LPN (Licensed Practical Nurse) on 01/05/23 at 1407  Med List Status: <None>   Medication Order Taking? Sig Documenting Provider Last Dose Status Informant  amLODipine (NORVASC) 10 MG tablet [id number]  Take 1 tablet (10 mg total) by mouth daily. Georgina Quint, MD  Active     Discontinued 08/15/19 1830   famotidine (PEPCID) 20 MG tablet [id number]  Take 1 tablet (20 mg total) by mouth 2 (two) times daily. Nira Conn, MD  Active   ondansetron (ZOFRAN ODT) 4 MG disintegrating tablet [id number] No Take 1 tablet (4 mg total) by mouth every 8 (eight) hours as needed for nausea or vomiting.  Patient not taking: Reported on 03/18/2020   Merrilee Jansky, MD Not Taking Active   ondansetron [facility]) 4 MG tablet [id number]  Take 1 tablet (4 mg total) by mouth every 6 (six) hours. Nira Conn, MD  Active   pantoprazole (PROTONIX) 20 MG tablet [id number] No Take 1 tablet (20 mg total) by mouth daily.  Patient not taking: Reported on 03/18/2020   Merrilee Jansky, MD  Not Taking Active     Discontinued 05/29/19 1025   rosuvastatin (CRESTOR) 10 MG tablet [id number]  Take 1 tablet (10 mg total) by mouth daily. Georgina Quint, MD  Active             Home Care and Equipment/Supplies: Were Home Health Services Ordered?: NA Any new equipment or medical supplies ordered?: NA  Functional Questionnaire: Do you need assistance with bathing/showering or dressing?: No Do you need assistance with meal preparation?: No Do you need assistance with eating?: No Do you have difficulty maintaining continence: No Do you need assistance with getting out of bed/getting out of a chair/moving?: No Do you have difficulty managing or taking your medications?: No  Follow up appointments reviewed: PCP Follow-up appointment confirmed?: NA Specialist Hospital Follow-up appointment confirmed?: NA Do you need transportation to your follow-up appointment?: No Do you understand care options if your condition(s) worsen?: Yes-patient verbalized understanding    SIGNATURE Karena Addison, LPN [facility] Nurse Health Advisor Direct Dial [phone]

## 2023-03-16 ENCOUNTER — Encounter: Payer: Self-pay | Admitting: Medical
